# Patient Record
Sex: Female | Born: 1935 | Race: White | Hispanic: No | Marital: Married | Smoking: Former smoker
Health system: Southern US, Community
[De-identification: ages and names within clinical notes are randomized; demographics above are authoritative.]

## PROBLEM LIST (undated history)

## (undated) DIAGNOSIS — G309 Alzheimer's disease, unspecified: Secondary | ICD-10-CM

## (undated) DIAGNOSIS — F419 Anxiety disorder, unspecified: Secondary | ICD-10-CM

## (undated) DIAGNOSIS — F0390 Unspecified dementia without behavioral disturbance: Secondary | ICD-10-CM

## (undated) DIAGNOSIS — C3490 Malignant neoplasm of unspecified part of unspecified bronchus or lung: Secondary | ICD-10-CM

## (undated) DIAGNOSIS — F329 Major depressive disorder, single episode, unspecified: Secondary | ICD-10-CM

## (undated) DIAGNOSIS — R413 Other amnesia: Secondary | ICD-10-CM

## (undated) DIAGNOSIS — F32A Depression, unspecified: Secondary | ICD-10-CM

## (undated) DIAGNOSIS — F0280 Dementia in other diseases classified elsewhere without behavioral disturbance: Secondary | ICD-10-CM

## (undated) DIAGNOSIS — I10 Essential (primary) hypertension: Secondary | ICD-10-CM

## (undated) DIAGNOSIS — G43909 Migraine, unspecified, not intractable, without status migrainosus: Secondary | ICD-10-CM

## (undated) DIAGNOSIS — G629 Polyneuropathy, unspecified: Secondary | ICD-10-CM

## (undated) DIAGNOSIS — J189 Pneumonia, unspecified organism: Secondary | ICD-10-CM

## (undated) DIAGNOSIS — M549 Dorsalgia, unspecified: Secondary | ICD-10-CM

## (undated) DIAGNOSIS — E039 Hypothyroidism, unspecified: Secondary | ICD-10-CM

## (undated) DIAGNOSIS — C801 Malignant (primary) neoplasm, unspecified: Secondary | ICD-10-CM

## (undated) DIAGNOSIS — H9190 Unspecified hearing loss, unspecified ear: Secondary | ICD-10-CM

## (undated) DIAGNOSIS — E785 Hyperlipidemia, unspecified: Secondary | ICD-10-CM

## (undated) DIAGNOSIS — M1990 Unspecified osteoarthritis, unspecified site: Secondary | ICD-10-CM

## (undated) DIAGNOSIS — E7800 Pure hypercholesterolemia, unspecified: Secondary | ICD-10-CM

## (undated) DIAGNOSIS — R569 Unspecified convulsions: Secondary | ICD-10-CM

## (undated) DIAGNOSIS — IMO0002 Reserved for concepts with insufficient information to code with codable children: Secondary | ICD-10-CM

## (undated) DIAGNOSIS — K219 Gastro-esophageal reflux disease without esophagitis: Secondary | ICD-10-CM

## (undated) DIAGNOSIS — R55 Syncope and collapse: Secondary | ICD-10-CM

## (undated) HISTORY — DX: Unspecified osteoarthritis, unspecified site: M19.90

## (undated) HISTORY — DX: Unspecified dementia, unspecified severity, without behavioral disturbance, psychotic disturbance, mood disturbance, and anxiety: F03.90

## (undated) HISTORY — DX: Alzheimer's disease, unspecified: G30.9

## (undated) HISTORY — DX: Other amnesia: R41.3

## (undated) HISTORY — DX: Reserved for concepts with insufficient information to code with codable children: IMO0002

## (undated) HISTORY — DX: Major depressive disorder, single episode, unspecified: F32.9

## (undated) HISTORY — DX: Migraine, unspecified, not intractable, without status migrainosus: G43.909

## (undated) HISTORY — PX: BACK SURGERY: SHX140

## (undated) HISTORY — DX: Polyneuropathy, unspecified: G62.9

## (undated) HISTORY — DX: Malignant (primary) neoplasm, unspecified: C80.1

## (undated) HISTORY — DX: Gastro-esophageal reflux disease without esophagitis: K21.9

## (undated) HISTORY — PX: HEMORRHOID SURGERY: SHX153

## (undated) HISTORY — DX: Hypothyroidism, unspecified: E03.9

## (undated) HISTORY — DX: Pneumonia, unspecified organism: J18.9

## (undated) HISTORY — DX: Unspecified hearing loss, unspecified ear: H91.90

## (undated) HISTORY — DX: Pure hypercholesterolemia, unspecified: E78.00

## (undated) HISTORY — DX: Dementia in other diseases classified elsewhere, unspecified severity, without behavioral disturbance, psychotic disturbance, mood disturbance, and anxiety: F02.80

## (undated) HISTORY — DX: Unspecified dementia without behavioral disturbance: F03.90

## (undated) HISTORY — DX: Hyperlipidemia, unspecified: E78.5

## (undated) HISTORY — DX: Syncope and collapse: R55

## (undated) HISTORY — DX: Essential (primary) hypertension: I10

## (undated) HISTORY — DX: Dorsalgia, unspecified: M54.9

## (undated) HISTORY — PX: VAGINAL HYSTERECTOMY: SUR661

## (undated) HISTORY — DX: Depression, unspecified: F32.A

## (undated) HISTORY — DX: Anxiety disorder, unspecified: F41.9

## (undated) HISTORY — PX: THYROIDECTOMY: SHX17

## (undated) HISTORY — PX: CHOLECYSTECTOMY: SHX55

## (undated) HISTORY — PX: APPENDECTOMY: SHX54

## (undated) HISTORY — DX: Malignant neoplasm of unspecified part of unspecified bronchus or lung: C34.90

## (undated) HISTORY — PX: KNEE SURGERY: SHX244

---

## 2006-12-01 ENCOUNTER — Encounter: Admission: RE | Admit: 2006-12-01 | Discharge: 2006-12-11 | Payer: Self-pay | Admitting: Orthopedic Surgery

## 2007-01-21 ENCOUNTER — Encounter: Admission: RE | Admit: 2007-01-21 | Discharge: 2007-02-20 | Payer: Self-pay | Admitting: Orthopedic Surgery

## 2009-02-20 ENCOUNTER — Encounter: Admission: RE | Admit: 2009-02-20 | Discharge: 2009-04-27 | Payer: Self-pay | Admitting: Orthopedic Surgery

## 2010-02-27 DIAGNOSIS — R55 Syncope and collapse: Secondary | ICD-10-CM

## 2010-02-27 HISTORY — DX: Syncope and collapse: R55

## 2010-04-17 ENCOUNTER — Ambulatory Visit (HOSPITAL_COMMUNITY)
Admission: RE | Admit: 2010-04-17 | Discharge: 2010-04-17 | Payer: Self-pay | Source: Home / Self Care | Attending: Family Medicine | Admitting: Family Medicine

## 2010-04-25 ENCOUNTER — Ambulatory Visit (HOSPITAL_COMMUNITY)
Admission: RE | Admit: 2010-04-25 | Discharge: 2010-04-25 | Payer: Self-pay | Source: Home / Self Care | Attending: Family Medicine | Admitting: Family Medicine

## 2010-04-29 DIAGNOSIS — C3490 Malignant neoplasm of unspecified part of unspecified bronchus or lung: Secondary | ICD-10-CM

## 2010-04-29 HISTORY — DX: Malignant neoplasm of unspecified part of unspecified bronchus or lung: C34.90

## 2010-04-29 HISTORY — PX: OTHER SURGICAL HISTORY: SHX169

## 2010-05-08 ENCOUNTER — Ambulatory Visit
Admission: RE | Admit: 2010-05-08 | Discharge: 2010-05-08 | Payer: Self-pay | Source: Home / Self Care | Attending: Thoracic Surgery | Admitting: Thoracic Surgery

## 2010-05-10 ENCOUNTER — Ambulatory Visit (HOSPITAL_COMMUNITY)
Admission: RE | Admit: 2010-05-10 | Discharge: 2010-05-10 | Payer: Self-pay | Source: Home / Self Care | Attending: Thoracic Surgery | Admitting: Thoracic Surgery

## 2010-05-20 ENCOUNTER — Encounter: Payer: Self-pay | Admitting: Family Medicine

## 2010-05-23 LAB — CBC
HCT: 34.3 % — ABNORMAL LOW (ref 36.0–46.0)
MCV: 92.7 fL (ref 78.0–100.0)
RBC: 3.7 MIL/uL — ABNORMAL LOW (ref 3.87–5.11)
WBC: 7.5 10*3/uL (ref 4.0–10.5)

## 2010-05-23 LAB — APTT: aPTT: 29 seconds (ref 24–37)

## 2010-05-23 LAB — URINALYSIS, ROUTINE W REFLEX MICROSCOPIC
Bilirubin Urine: NEGATIVE
Hgb urine dipstick: NEGATIVE
Specific Gravity, Urine: 1.026 (ref 1.005–1.030)
Urine Glucose, Fasting: NEGATIVE mg/dL
Urobilinogen, UA: 0.2 mg/dL (ref 0.0–1.0)

## 2010-05-23 LAB — BLOOD GAS, ARTERIAL
Acid-base deficit: 0.6 mmol/L (ref 0.0–2.0)
Bicarbonate: 23.3 mEq/L (ref 20.0–24.0)
TCO2: 24.4 mmol/L (ref 0–100)
pCO2 arterial: 36.7 mmHg (ref 35.0–45.0)
pO2, Arterial: 83.3 mmHg (ref 80.0–100.0)

## 2010-05-23 LAB — COMPREHENSIVE METABOLIC PANEL
AST: 30 U/L (ref 0–37)
Albumin: 3.5 g/dL (ref 3.5–5.2)
Alkaline Phosphatase: 83 U/L (ref 39–117)
Chloride: 105 mEq/L (ref 96–112)
Creatinine, Ser: 1.35 mg/dL — ABNORMAL HIGH (ref 0.4–1.2)
GFR calc Af Amer: 46 mL/min — ABNORMAL LOW (ref >60)
Potassium: 5 mEq/L (ref 3.5–5.1)
Total Bilirubin: 0.1 mg/dL — ABNORMAL LOW (ref 0.3–1.2)
Total Protein: 7.1 g/dL (ref 6.0–8.3)

## 2010-05-23 LAB — PROTIME-INR: INR: 0.98 (ref 0.00–1.49)

## 2010-05-23 LAB — URINE MICROSCOPIC-ADD ON

## 2010-05-23 LAB — SURGICAL PCR SCREEN: Staphylococcus aureus: NEGATIVE

## 2010-05-24 ENCOUNTER — Inpatient Hospital Stay (HOSPITAL_COMMUNITY)
Admission: RE | Admit: 2010-05-24 | Discharge: 2010-05-29 | Disposition: A | Discharge status: Home / Self Care | Payer: PRIVATE HEALTH INSURANCE | Source: Home / Self Care | Attending: Thoracic Surgery | Admitting: Thoracic Surgery

## 2010-05-24 LAB — TYPE AND SCREEN: ABO/RH(D): O POS

## 2010-05-25 LAB — POCT I-STAT 3, ART BLOOD GAS (G3+)
Acid-Base Excess: 2 mmol/L (ref 0.0–2.0)
Bicarbonate: 25.9 mEq/L — ABNORMAL HIGH (ref 20.0–24.0)
pH, Arterial: 7.432 — ABNORMAL HIGH (ref 7.350–7.400)
pO2, Arterial: 71 mmHg — ABNORMAL LOW (ref 80.0–100.0)

## 2010-05-25 LAB — BASIC METABOLIC PANEL
Calcium: 8.8 mg/dL (ref 8.4–10.5)
Chloride: 102 mEq/L (ref 96–112)
Creatinine, Ser: 1.05 mg/dL (ref 0.4–1.2)
GFR calc Af Amer: >60 mL/min (ref >60)
Sodium: 137 mEq/L (ref 135–145)

## 2010-05-25 LAB — CBC
MCV: 93.3 fL (ref 78.0–100.0)
Platelets: 182 10*3/uL (ref 150–400)
RBC: 3.12 MIL/uL — ABNORMAL LOW (ref 3.87–5.11)
RDW: 14 % (ref 11.5–15.5)
WBC: 12.2 10*3/uL — ABNORMAL HIGH (ref 4.0–10.5)

## 2010-05-26 LAB — COMPREHENSIVE METABOLIC PANEL
AST: 42 U/L — ABNORMAL HIGH (ref 0–37)
Albumin: 2.7 g/dL — ABNORMAL LOW (ref 3.5–5.2)
Alkaline Phosphatase: 79 U/L (ref 39–117)
CO2: 27 mEq/L (ref 19–32)
Chloride: 101 mEq/L (ref 96–112)
GFR calc Af Amer: >60 mL/min (ref >60)
Potassium: 4.1 mEq/L (ref 3.5–5.1)
Total Bilirubin: 0.5 mg/dL (ref 0.3–1.2)

## 2010-05-26 LAB — CBC
Hemoglobin: 9.8 g/dL — ABNORMAL LOW (ref 12.0–15.0)
MCV: 93.1 fL (ref 78.0–100.0)
Platelets: 176 10*3/uL (ref 150–400)
RBC: 3.2 MIL/uL — ABNORMAL LOW (ref 3.87–5.11)
WBC: 14.5 10*3/uL — ABNORMAL HIGH (ref 4.0–10.5)

## 2010-05-27 LAB — TYPE AND SCREEN
ABO/RH(D): O POS
Antibody Screen: NEGATIVE
Unit division: 0
Unit division: 0

## 2010-05-27 LAB — MRSA PCR SCREENING: MRSA by PCR: NEGATIVE

## 2010-05-27 LAB — BASIC METABOLIC PANEL
Calcium: 8.9 mg/dL (ref 8.4–10.5)
Creatinine, Ser: 1.12 mg/dL (ref 0.4–1.2)
GFR calc Af Amer: 57 mL/min — ABNORMAL LOW (ref >60)
GFR calc non Af Amer: 47 mL/min — ABNORMAL LOW (ref >60)

## 2010-05-27 LAB — CBC
MCH: 30.6 pg (ref 26.0–34.0)
MCHC: 33.4 g/dL (ref 30.0–36.0)
Platelets: 211 10*3/uL (ref 150–400)
RDW: 13.3 % (ref 11.5–15.5)

## 2010-05-27 NOTE — Op Note (Signed)
[name], [name]               ACCOUNT NO.:  [id number]  MEDICAL RECORD NO.:  [id number]           PATIENT TYPE:  LOCATION:                                 FACILITY:  PHYSICIAN:  Ines Bloomer, M.D. DATE OF BIRTH:  [birth year]  DATE OF PROCEDURE: DATE OF DISCHARGE:                              OPERATIVE REPORT   PREOPERATIVE DIAGNOSIS:  Left upper lobe mass.  POSTOPERATIVE DIAGNOSIS:  Adenocarcinoma, left upper lobe.  OPERATION PERFORMED:  Left upper lobe apical posterior segmentectomy with node dissection.  SURGEON:  Ines Bloomer, MD  ANESTHESIA:  General anesthesia.  After percutaneous insertion of all monitoring lines, the patient underwent general anesthesia and was prepped and draped in the usual sterile manner.  The patient was turned to the left lateral thoracotomy position.  The left lung was deflated.  Two trocar sites were made, one at the anterior aspect of the seventh intercostal space and one at the posterior axillary line of the eighth intercostal space.  Two trocars were inserted.  A 0-degree scope was inserted.  The lesion was seen in the apical segment that was kind of posteriorly in the left upper lobe. A third incision was made over the triangle of auscultation and the latissimus was partially divided and the serratus was reflected anteriorly.  The intercostal muscle between the fifth and sixth ribs were taken down with electrocautery and a portion of the fifth rib was divided at the angle with shears and TPA was inserted.  The lung was hyperinflated, so it was difficult to do a dissection with the dissection with shear on the superior portion of the fissures dissecting out an 11L node and dissecting out the anterior branch artery.  This was stapled and divided with Covidien stapler.  We then dissected out two 5 nodes and another 10L node and exposed the apical branch which was stapled out and divided with the stapler.  There was one kind of  more posterior branch still remaining but I dif not think we had to divide that.  We then came across the staple line across the lung with the Covidien purple stapler with several applications.  The lesion was sent for frozen section which was an adenocarcinoma.  There was 1 atypical cell at the staple line, but the pathologist thought that, that was benign.  We had at least a 2.5-cm gross margin.  Because of the patient's pulmonary status which I felt was borderline, we went ahead and inserted 2 chest tubes through the trocar sites.  We did a single On- Q and a Marcaine block in the usual fashion.  Chest was closed with 3 pericostals drilling through the sixth rib and passing around the fifth rib and putting the intercostal muscle in between, #1 Vicryl on the muscle layer and 2-0 Vicryl on the subcutaneous tissue, and Dermabond for the skin.  The patient is turned to the recovery room in stable condition.     Ines Bloomer, M.D.     DPB/MEDQ  D:  05/24/2010  T:  05/25/2010  Job:  [id number]  Electronically Signed by Jovita Gamma M.D. on 05/27/2010  05:21:52 PM

## 2010-05-27 NOTE — H&P (Signed)
Suzanne Fritz, Suzanne Fritz               ACCOUNT NO.:  [id number]  MEDICAL RECORD NO.:  [id number]           PATIENT TYPE:  LOCATION:                                 FACILITY:  PHYSICIAN:  Ines Bloomer, M.D. DATE OF BIRTH:  1931/09/07  DATE OF ADMISSION: DATE OF DISCHARGE:                             HISTORY & PHYSICAL   CHIEF COMPLAINT:  Left upper lobe mass.  HISTORY OF PRESENT ILLNESS:  This 77 year old patient had smoked for 30 years but quit smoking when she was found to have a left upper lobe mass and a needle biopsy showed epithelioid cells, suspicious for malignancy. PET scan was positive in this area but on no other areas.  She has history of bronchitis, was treated with Rocephin, the standard uptake value was 3.2.  She has had no hemoptysis, fever, chills, or excessive sputum.  Her pulmonary function tests were done.  The lesion was 1.8 x 2.1.  MEDICATIONS:  She is on alprazolam, Zyrtec, Hemocyte Plus, Neurontin,fluoxetine, hydrocodone, levothyroxine, pravastatin, benazepril, Namenda, triamterene and hydrochlorothiazide, estradiol, aspirin.  She is allergic to CODEINE with nausea and vomiting.  She has hypertension, hypercholesterolemia, history of depression.  FAMILY HISTORY:  Noncontributory.  SOCIAL HISTORY:  She is married, three chill, retired.  Quit smoking 36 years ago.  Does not drink alcohol on a regular basis.  REVIEW OF SYSTEMS:  VITAL SIGNS:  Her weight is 5 feet 6 inches.  She is 230 pounds. GENERAL:  Her weight, she had some recent weight gain. CARDIAC:  No angina or atrial fibrillation. PULMONARY:  See history of present illness. GASTROINTESTINAL:  Reflux and some history of melanoma. GENITOURINARY:  No kidney disease, dysuria, or frequent urination. VASCULAR:  No claudication, DVT, or TIAs. NEUROLOGIC:  Headaches. MUSCULOSKELETAL:  No arthritis in joints. PSYCHIATRIC:  Depression. EYE/ENT:  She had recent decrease in her eyesight.  No changes in  her hearing. HEMATOLOGIC:  No problems with bleeding, clotting disorders, or anemia.  PHYSICAL EXAMINATION:  GENERAL:  She is slightly obese Caucasian female, in no acute distress. VITAL SIGNS:  Blood pressure is 102/60, pulse 88, respirations 18, sats were 98%. HEENT:  Head is atraumatic.  Eyes:  Pupils equal and reactive to light and accommodation.  Extraocular movements are normal.  Ears:  Tympanic membranes intact.  Nose:  There is no septal deviation.  Throat is without lesion. NECK:  Supple without thyromegaly.  There is no supraclavicular or axillary adenopathy. CHEST:  Clear to auscultation and percussion. HEART:  Regular sinus rhythm.  No murmurs. ABDOMEN:  Soft and obese.  Bowel sounds are normal. EXTREMITIES:  Pulses are 2+.  There is no clubbing or edema. NEUROLOGIC:  She is oriented x3.  Sensory and motor intact.  Cranial nerves intact.  IMPRESSION: 1. Left upper lobe mass. 2. History of tobacco abuse. 3. Hypertension. 4. Hypercholesterolemia. 5. History of depression.  PLAN:  Left VATS and probable left upper lobe trisegmentectomy and node dissection.     Ines Bloomer, M.D.     DPB/MEDQ  D:  05/21/2010  T:  05/22/2010  Job:  [id number]  Electronically Signed by Jovita Gamma  M.D. on 05/27/2010 05:20:55 PM

## 2010-05-28 LAB — CBC
MCV: 91.3 fL (ref 78.0–100.0)
Platelets: 240 10*3/uL (ref 150–400)
RDW: 13.1 % (ref 11.5–15.5)
WBC: 13.4 10*3/uL — ABNORMAL HIGH (ref 4.0–10.5)

## 2010-05-28 LAB — BASIC METABOLIC PANEL
BUN: 17 mg/dL (ref 6–23)
GFR calc non Af Amer: 44 mL/min — ABNORMAL LOW (ref >60)
Potassium: 3.9 mEq/L (ref 3.5–5.1)
Sodium: 133 mEq/L — ABNORMAL LOW (ref 135–145)

## 2010-05-29 LAB — BASIC METABOLIC PANEL
CO2: 26 mEq/L (ref 19–32)
Chloride: 98 mEq/L (ref 96–112)
GFR calc Af Amer: >60 mL/min (ref >60)
Sodium: 134 mEq/L — ABNORMAL LOW (ref 135–145)

## 2010-05-29 LAB — CBC
Hemoglobin: 9.2 g/dL — ABNORMAL LOW (ref 12.0–15.0)
MCH: 30.4 pg (ref 26.0–34.0)
RBC: 3.03 MIL/uL — ABNORMAL LOW (ref 3.87–5.11)

## 2010-06-03 NOTE — Discharge Summary (Signed)
Suzanne Fritz, Suzanne Fritz               ACCOUNT NO.:  [id number]  MEDICAL RECORD NO.:  [id number]          PATIENT TYPE:  INP  LOCATION:  3308                         FACILITY:  [facility]  PHYSICIAN:  Ines Bloomer, M.D. DATE OF BIRTH:  1932/04/22  DATE OF ADMISSION:  05/24/2010 DATE OF DISCHARGE:  05/29/2010                              DISCHARGE SUMMARY   HISTORY:  The patient is a 77 year old white female with a history of long-term tobacco abuse, although she has quit who was found and referred to Dr. Edwyna Shell to have a left upper lobe lung mass.  The patient had a needle biopsy, which showed epithelioid cells and findings suspicious for malignancy.  She underwent a PET scan, and it was positive only for this lesion in the left upper lobe.  The patient has a history of recent bronchitis treated with Rocephin.  She was referred to Dr. Edwyna Shell, and he felt she would require resection.  She was admitted this hospitalization for the procedure.  PAST MEDICAL HISTORY:  Gastroesophageal reflux, peripheral neuropathy, hypothyroidism, hypercholesterolemia, early dementia, hypertension, depression.  ALLERGIES:  CODEINE causes nausea.  FAMILY HISTORY:  Noncontributory.  SOCIAL HISTORY:  She is retired.  She quit smoking 36 years ago.  She does not drink alcohol on a regular basis.  She is married.  She has three children.  PHYSICAL EXAMINATION AND REVIEW OF SYSTEMS:  Please see the history and physical.  HOSPITAL COURSE:  The patient was admitted electively on May 24, 2010.  She underwent a left upper lobe apical posterior segmentectomy with lymph node sampling by Dr. Edwyna Shell.  She tolerated the procedure well and was taken to the Post Anesthesia Care Unit in stable condition. The initial frozen section was notable for non-small cell lung cancer.  POSTOPERATIVE HOSPITAL COURSE:  The patient has overall progressed nicely.  She has maintained stable hemodynamics.  All routine  lines, monitors, and drainage devices have been discontinued in the standard fashion or routine protocols.  She does have acute blood loss anemia with most recent hemoglobin/hematocrit 9.2 and 27.6 respectively.  She has been started on her iron.  Her incisions are healing well without evidence of infection.  She is tolerating gradual increase in activity using standard protocols.  Chest x-ray is felt to be stable in appearance with some left perihilar fullness, but aeration is improving overtime.  She has been weaned from oxygen and maintained on good saturations on room air.  She has had no significant cardiac dysrhythmias.  Overall, she was felt to be stable for discharge on today's date May 29, 2010.  Pathology revealed adenocarcinoma 2.8 cm with no margin involvement, and no tumor identified in the multiple lymph node samples.  Medications on discharge include the following, new medications will be: 1. Ferrous sulfate 325 mg daily. 2. Oxycodone/APAP 5/325 mg 1-2 every 4-6 hours as needed for pain. 3. Senokot-S p.r.n. at bedtime.  Her other medications are the same and include the following: 1. Aleve 220 mg daily p.r.n. 2. Aspirin 81 mg daily. 3. Calcium 600 mg q.a.m. 4. Cetirizine 10 mg q.a.m. 5. Vitamin B12 1000 mcg q.a.m. 6. Donepezil  10 mg 1 tablet daily at bedtime. 7. Estradiol 0.5 mg b.i.d. 8. Fluoxetine 40 mg q.a.m. 9. Gabapentin 300 mg q.a.m. 10.Glucosamine chondroitin 1 capsule q.a.m. 11.Hemocyte Plus 1 tablet twice daily. 12.Levothyroxine 100 mcg alternating daily with 88 mcg. 13.Meclizine 25 mg p.r.n. for dizziness. 14.Namenda 10 mg b.i.d. 15.Omeprazole 20 mg b.i.d. 16.Pravachol 80 mg q.a.m. 17.Prenatal vitamin 1 daily. 18.Promethazine 25 mg p.o. p.r.n. 19.Triamterene/hydrochlorothiazide 37.5/25 mg 1 q.a.m. 20.Vitamin D 1000 units at bedtime. 21.Vitamin E 400 units b.i.d.  FINAL DIAGNOSES:  Adenocarcinoma of the left upper lobe status post apical  posterior segmentectomy and lymph node sampling.  OTHER DIAGNOSES:  Acute blood loss anemia, history of hypertension, history of hypercholesterolemia, history of depression, history of dementia, history of neuropathy, history of gastroesophageal reflux.  INSTRUCTIONS:  The patient will receive written instructions in regard to medications, activity, diet, wound care, and followup.  FOLLOWUP:  Dr. Edwyna Shell on June 15, 2010, at 3:00 p.m. with a chest x- ray from [facility].     Rowe Clack, P.A.-C.   ______________________________ Ines Bloomer, M.D.   Sherryll Burger  D:  05/29/2010  T:  05/30/2010  Job:  [id number]  cc:   Ernestina Penna, M.D.  Electronically Signed by Gershon Crane P.A.-C. on 05/31/2010 01:10:23 PM Electronically Signed by Jovita Gamma M.D. on 06/03/2010 04:59:07 PM

## 2010-06-04 ENCOUNTER — Other Ambulatory Visit: Payer: Self-pay | Admitting: Thoracic Surgery

## 2010-06-04 DIAGNOSIS — D381 Neoplasm of uncertain behavior of trachea, bronchus and lung: Secondary | ICD-10-CM

## 2010-06-05 ENCOUNTER — Ambulatory Visit (INDEPENDENT_AMBULATORY_CARE_PROVIDER_SITE_OTHER): Payer: Medicare Other | Admitting: Thoracic Surgery

## 2010-06-05 ENCOUNTER — Ambulatory Visit
Admission: RE | Admit: 2010-06-05 | Discharge: 2010-06-05 | Disposition: A | Discharge status: Home / Self Care | Payer: PRIVATE HEALTH INSURANCE | Source: Ambulatory Visit | Attending: Thoracic Surgery | Admitting: Thoracic Surgery

## 2010-06-05 DIAGNOSIS — C3410 Malignant neoplasm of upper lobe, unspecified bronchus or lung: Secondary | ICD-10-CM

## 2010-06-05 DIAGNOSIS — D381 Neoplasm of uncertain behavior of trachea, bronchus and lung: Secondary | ICD-10-CM

## 2010-06-08 NOTE — Letter (Signed)
June 05, 2010  Ernestina Penna, MD [address] [place] [address]  Re:  KYLEI, PURINGTON               DOB:  1931/10/27  Dr. Roe Coombs,  I saw the patient back today after we did a left upper lobe apical segmentectomy for a stage IB non-small cell lung cancer.  Incisions are well-healed.  She is doing well overall.  Her blood pressure was 140/80, pulse 80, respirations 18, sats were 97%.  Plan to see her back again in 3 weeks with a chest x-ray.  I told her to gradually increase her activity.  Ines Bloomer, M.D. Electronically Signed  DPB/MEDQ  D:  06/05/2010  T:  06/06/2010  Job:  [id number]

## 2010-06-18 ENCOUNTER — Other Ambulatory Visit: Payer: Self-pay | Admitting: Thoracic Surgery

## 2010-06-18 DIAGNOSIS — C3410 Malignant neoplasm of upper lobe, unspecified bronchus or lung: Secondary | ICD-10-CM

## 2010-06-19 ENCOUNTER — Ambulatory Visit (INDEPENDENT_AMBULATORY_CARE_PROVIDER_SITE_OTHER): Payer: Self-pay | Admitting: Thoracic Surgery

## 2010-06-19 ENCOUNTER — Ambulatory Visit
Admission: RE | Admit: 2010-06-19 | Discharge: 2010-06-19 | Disposition: A | Discharge status: Home / Self Care | Payer: Medicare Other | Source: Ambulatory Visit | Attending: Thoracic Surgery | Admitting: Thoracic Surgery

## 2010-06-19 DIAGNOSIS — C3410 Malignant neoplasm of upper lobe, unspecified bronchus or lung: Secondary | ICD-10-CM

## 2010-06-20 NOTE — Assessment & Plan Note (Unsigned)
OFFICE VISIT  Suzanne Fritz, Suzanne Fritz DOB:  [birth year]                                        June 19, 2010 CHART #:  [id number]  The patient is status post left thoracotomy with left upper lobe echo apical posterior segmentectomy done by Dr. Edwyna Shell on May 24, 2010. This showed stage IB non-small cell lung cancer.  She was last seen in the office by Dr. Edwyna Shell on June 05, 2010.  She presents today for a 3-week follow-up.  The patient feels that she is progressing well.  She is up ambulating well without difficulty.  She denies any shortness of breath, cough, hemoptysis, or wheezing.  She denies any fevers.  She has been to see the oncologist.  PHYSICAL EXAMINATION:  VITAL SIGNS:  Blood pressure 115/71, pulse 104, respirations 18, O2 sats 98% on room air.  RESPIRATORY:  Clear to auscultation bilaterally.  CARDIAC:  Regular rate and rhythm. INCISIONS:  Thoracotomy site is healing well.  The patient's one chest tube site dehisced, but no purulent drainage or erythema noted.  STUDIES:  The patient had PA and lateral chest x-ray obtained today, which shows postoperative changes.  No pneumothorax noted.  IMPRESSION AND PLAN:  The patient is progressing well postoperatively. She is to continue to increase her activity as tolerated.  She is to wash her chest tube site using soap and water daily and bandage with a dry dressing.  The patient is to contact us if this area starts draining, becomes painful, or red.  We will see her back sooner.  We will plan to see her back in 3 weeks with a followup chest x-ray at that time.  The patient is in agreement.  Sol Blazing, PA  KMD/MEDQ  D:  06/19/2010  T:  06/20/2010  Job:  [id number]  cc:   Ernestina Penna, M.D.

## 2010-07-09 ENCOUNTER — Other Ambulatory Visit: Payer: Self-pay | Admitting: Thoracic Surgery

## 2010-07-09 DIAGNOSIS — C3410 Malignant neoplasm of upper lobe, unspecified bronchus or lung: Secondary | ICD-10-CM

## 2010-07-09 LAB — GLUCOSE, CAPILLARY: Glucose-Capillary: 98 mg/dL (ref 70–99)

## 2010-07-09 LAB — CBC
Hemoglobin: 12 g/dL (ref 12.0–15.0)
MCH: 30.3 pg (ref 26.0–34.0)
MCV: 92.2 fL (ref 78.0–100.0)
RBC: 3.96 MIL/uL (ref 3.87–5.11)

## 2010-07-10 ENCOUNTER — Ambulatory Visit (INDEPENDENT_AMBULATORY_CARE_PROVIDER_SITE_OTHER): Payer: Self-pay | Admitting: Thoracic Surgery

## 2010-07-10 ENCOUNTER — Ambulatory Visit
Admission: RE | Admit: 2010-07-10 | Discharge: 2010-07-10 | Disposition: A | Discharge status: Home / Self Care | Payer: Medicare Other | Source: Ambulatory Visit | Attending: Thoracic Surgery | Admitting: Thoracic Surgery

## 2010-07-10 DIAGNOSIS — C3410 Malignant neoplasm of upper lobe, unspecified bronchus or lung: Secondary | ICD-10-CM

## 2010-07-10 DIAGNOSIS — C3490 Malignant neoplasm of unspecified part of unspecified bronchus or lung: Secondary | ICD-10-CM

## 2010-07-11 NOTE — Assessment & Plan Note (Unsigned)
OFFICE VISIT  Suzanne Fritz, DEACON DOB:  [birth year]                                        July 10, 2010 CHART #:  [id number]  HISTORY OF PRESENT ILLNESS:  The patient is status post left thoracotomy with left upper lobe apical posterior segmentectomy done by Dr. Edwyna Shell on May 24, 2010.  This showed stage one B non-small cell lung cancer.  She has seen the oncologist and does not require any further treatment at this time.  The patient presents today for followup visit. She still had some mild pain radiating around her left breast. Otherwise, the patient feels she is progressing well.  She is up ambulating well without difficulty.  She states her chest tube site is healing well.  She denies any nausea, vomiting, fevers, coughing, hemoptysis, or wheezing.  PHYSICAL EXAMINATION:  VITAL SIGNS:  Blood pressure 109/66, pulse of 96, respirations of 18, O2 sats 97% on room air. RESPIRATORY:  Clear to auscultation bilaterally. CARDIAC:  Regular rate and rhythm.  Incisions:  All incisions are clean, dry and intact and healing well.  The patient's chest tube site continues to heal well.  No signs of purulent drainage or erythema.  IMPRESSION AND PLAN:  Ms. Hakanson continues to progress well postoperatively.  She was seen and evaluated today by Dr. Edwyna Shell.  PLAN:  Continue to progress with her activity as tolerated.  We will plan to see her back in 3 months with a repeat PA and lateral chest x- ray at that time.  In the interim, if the patient has any surgical complaints.  She is to contact us and we will see her sooner.  The patient is in agreement.  I did give the patient a prescription for refill on her hydrocodone 7.5/500, total #40.  Sol Blazing, PA  KMD/MEDQ  D:  07/10/2010  T:  07/11/2010  Job:  [id number]  cc:   Ernestina Penna, M.D.

## 2010-09-11 ENCOUNTER — Encounter: Payer: Self-pay | Admitting: Cardiology

## 2010-09-11 NOTE — Letter (Signed)
May 08, 2010   Ernestina Penna, M.D.  [address] [place], [place] [address]   Re:  RECIE, CIRRINCIONE               DOB:  10-24-1931   Dear Roe Coombs;   I appreciate the opportunity of seeing the patient.  This 77 year old  Caucasian female, smoked for 30 years but quit smoking now, was found to  have a left upper lobe mass in which she underwent a needle biopsy which  showed some epithelioid cells suspicious for malignancy.  Her PET scan  is positive in this area and no other areas.  She has a history of  bronchitis and was treated with Rocephin.  A standard uptake value on  the PET scan was 3.5, all of this corresponds with a malignancy.  She is  referred here for a possible treatment.  She has had no hemoptysis,  fever, chills or excessive sputum.   MEDICATIONS:  Omeprazole 20 mg twice a day, niclosamide 75 mg twice a  day, Zyrtec 10 mg a day, Hemocyte Plus twice a day, Neurontin 300 mg at  night, fluoxetine 40 mg in the morning, hydrocodone, levothyroxine 81  mcg a day, pravastatin 80 mg a day, benazepril 10 mg at night, Namenda  10 mg twice a day, triamterene/hydrochlorothiazide 37.5 daily, estradiol  0.5 twice a day, aspirin, calcium, multiple vitamins.   ALLERGIES:  Codeine causes her nausea.   She has hypertension, hypercholesterolemia, depression.   FAMILY HISTORY:  Noncontributory.   SOCIAL HISTORY:  She is married, has three children.  She is retired.  Quit smoking 36 years ago.  Does not drink alcohol on a regular basis.   PHYSICAL EXAMINATION:  Weight she is 5 feet 6 inches, 230 pounds.  She  had some recent weight gain.  CARDIAC:  No angina or atrial fibrillation.  PULMONARY:  See history of present illness.  GI:  Reflux.  Had previous melena.  GU:  No kidney disease, dysuria, or frequent urination.  VASCULAR:  No claudication, DVT, or TIAs.  NEUROLOGICAL:  Headaches.  MUSCULOSKELETAL:  Arthritis and joint pain.  PSYCHIATRIC:  Depression.  EYE/ENT:  She had  recent decrease in her eyesight.  HEMATOLOGICAL:  No problems with bleeding, clotting disorders, or  anemia.   PHYSICAL EXAMINATION:  She is a slightly obese Caucasian female in no  acute distress.  Her blood pressure is 102/62, pulse 88, respirations  18, saturations were 98%.  Head, Eyes, Ears, Nose and Throat:  Unremarkable.  Neck:  Supple without thyromegaly.  There is no  supraclavicular or axillary adenopathy.  Chest:  Clear to auscultation  and percussion.  Heart:  Regular sinus rhythm.  No murmurs.  Abdomen:  Obese.  Bowel sounds are normal.  Extremities:  Pulses are 2+.  No  clubbing or edema.  Neurological:  She is oriented x3.  Sensory and  motor intact.  Cranial nerves intact.   I feel that she probably has a low grade cancer and would benefit from  VATS trisegmentectomy and node dissection.  We will get pulmonary  function tests on her and brain scan that was negative.  If those are  negative, plan to proceed with surgery on January 26 at [facility].   I appreciate the opportunity of seeing the patient.   Ines Bloomer, M.D.  Electronically Signed   DPB/MEDQ  D:  05/08/2010  T:  05/09/2010  Job:  [id number]   cc:   Tania Ade  Darl Pikes, NP

## 2010-09-12 ENCOUNTER — Ambulatory Visit (INDEPENDENT_AMBULATORY_CARE_PROVIDER_SITE_OTHER): Payer: Medicare Other | Admitting: Cardiology

## 2010-09-12 ENCOUNTER — Encounter: Payer: Self-pay | Admitting: Cardiology

## 2010-09-12 DIAGNOSIS — Z01810 Encounter for preprocedural cardiovascular examination: Secondary | ICD-10-CM

## 2010-09-12 DIAGNOSIS — E785 Hyperlipidemia, unspecified: Secondary | ICD-10-CM

## 2010-09-12 DIAGNOSIS — I10 Essential (primary) hypertension: Secondary | ICD-10-CM

## 2010-09-12 NOTE — Patient Instructions (Signed)
Continue current medications You are OK for surgery Follow up is needed

## 2010-09-12 NOTE — Assessment & Plan Note (Signed)
I reviewed her recent lipids. Her triglycerides were markedly elevated and we discussed dietary changes which she has not been following. After she's been doing this for 3 or 4 months she should have repeat labs.

## 2010-09-12 NOTE — Assessment & Plan Note (Signed)
The patient is an acceptable risk for the planned surgery. At this point no further testing is indicated according to the ACC/AHA guidelines. She has a moderate functional level, no high-risk findings or risk factors and is going for surgery there is moderate risk from a cardiovascular standpoint.

## 2010-09-12 NOTE — Assessment & Plan Note (Signed)
The blood pressure is at target. No change in medications is indicated. We will continue with therapeutic lifestyle changes (TLC).  

## 2010-09-12 NOTE — Progress Notes (Signed)
HPI The patient presents for preoperative clearance. She is to have hip surgery. She's not had any prior cardiac history. I reviewed all available records. Of note she recently had left upper lobectomy for adenocarcinoma. She tolerated this surgery without problems. She is somewhat limited by her hip pain but she can ambulate. With this level of activity she denies any chest pressure, neck or arm discomfort. She has had no palpitations, presyncope or syncope. She has had no weight gain or edema. She denies any shortness of breath, PND or orthopnea.  Allergies  Allergen Reactions  . Codeine     Current Outpatient Prescriptions  Medication Sig Dispense Refill  . aspirin 81 MG tablet Take 81 mg by mouth daily.        . Calcium Carbonate (CALCIUM 600 PO) Take by mouth. 1 po daily       . Cholecalciferol (VITAMIN D) 1000 UNITS capsule Take 1,000 Units by mouth daily.        Marland Kitchen donepezil (ARICEPT) 10 MG tablet Take 10 mg by mouth at bedtime as needed.        Marland Kitchen FLUoxetine (PROZAC) 40 MG capsule Take 40 mg by mouth daily.        Marland Kitchen glucosamine-chondroitin 500-400 MG tablet Take 1 tablet by mouth daily.        Marland Kitchen levothyroxine (SYNTHROID, LEVOTHROID) 100 MCG tablet Take 100 mcg by mouth.       . meclizine (ANTIVERT) 25 MG tablet Take 25 mg by mouth as needed.        . memantine (NAMENDA) 10 MG tablet Take 10 mg by mouth 2 (two) times daily.        Marland Kitchen omeprazole (PRILOSEC) 20 MG capsule Take 20 mg by mouth 2 (two) times daily.        . promethazine (PHENERGAN) 25 MG tablet Take 25 mg by mouth every 6 (six) hours as needed.        . triamterene-hydrochlorothiazide (MAXZIDE-25) 37.5-25 MG per tablet Take 1 tablet by mouth daily.        . vitamin E 400 UNIT capsule Take 400 Units by mouth daily.        Marland Kitchen DISCONTD: cetirizine (ZYRTEC) 10 MG tablet Take 10 mg by mouth daily.        Marland Kitchen DISCONTD: B Complex-C-Min-Fe-FA (HEMOCYTE-PLUS PO) Take by mouth. 1 po bid       . DISCONTD: cyanocobalamin 1000 MCG tablet Take  100 mcg by mouth daily.        Marland Kitchen DISCONTD: estradiol (ESTRACE) 0.5 MG tablet Take 0.5 mg by mouth daily.        Marland Kitchen DISCONTD: ferrous sulfate 325 (65 FE) MG EC tablet Take 325 mg by mouth daily with breakfast.        . DISCONTD: gabapentin (NEURONTIN) 300 MG capsule Take 300 mg by mouth daily.        Marland Kitchen DISCONTD: naproxen sodium (ANAPROX) 220 MG tablet Take 220 mg by mouth as needed.        Marland Kitchen DISCONTD: oxyCODONE-acetaminophen (PERCOCET) 5-325 MG per tablet Take 1 tablet by mouth every 4 (four) hours as needed.        Marland Kitchen DISCONTD: pravastatin (PRAVACHOL) 80 MG tablet Take 80 mg by mouth daily.        Marland Kitchen DISCONTD: Prenatal MV-Min-Fe Fum-FA-DHA (PRENATAL 1 PO) Take by mouth. 1 po daily       . DISCONTD: Sennosides (SENOKOT PO) Take by mouth daily.  Past Medical History  Diagnosis Date  . Hypercholesterolemia   . Depression   . Hypertension   . GERD (gastroesophageal reflux disease)   . Peripheral neuropathy   . Dementia   . Hypothyroidism   . Adenocarcinoma     Past Surgical History  Procedure Date  . Left upper lobe resection   . Appendectomy   . Thyroidectomy   . Cholecystectomy     Family History  Problem Relation Age of Onset  . Stroke Mother 49  . Cancer Father 82    Stomach    History   Social History  . Marital Status: Married    Spouse Name: N/A    Number of Children: 3  . Years of Education: N/A   Occupational History  . Retired    Social History Main Topics  . Smoking status: Former Smoker    Quit date: 04/29/1974  . Smokeless tobacco: Not on file  . Alcohol Use: Not on file  . Drug Use: Not on file  . Sexually Active: Not on file   Other Topics Concern  . Not on file   Social History Narrative    She is married, three chill, retired.  Quit smoking 36  years ago.  Does not drink alcohol on a regular basis.     ROS:  As stated in the HPI and negative for all other systems.   PHYSICAL EXAM BP 112/60  Pulse 70  Resp 16  Ht 5\' 6"  (1.676 m)   Wt 192 lb (87.091 kg)  BMI 30.99 kg/m2 GENERAL:  Well appearing HEENT:  Pupils equal round and reactive, fundi not visualized, oral mucosa unremarkable NECK:  No jugular venous distention, waveform within normal limits, carotid upstroke brisk and symmetric, no bruits, no thyromegaly LYMPHATICS:  No cervical, inguinal adenopathy LUNGS:  Clear to auscultation bilaterally BACK:  No CVA tenderness CHEST:  Unremarkable, left surgical scar HEART:  PMI not displaced or sustained,S1 and S2 within normal limits, no S3, no S4, no clicks, no rubs, no murmurs ABD:  Flat, positive bowel sounds normal in frequency in pitch, no bruits, no rebound, no guarding, no midline pulsatile mass, no hepatomegaly, no splenomegaly EXT:  2 plus pulses throughout, no edema, no cyanosis no clubbing SKIN:  No rashes no nodules NEURO:  Cranial nerves II through XII grossly intact, motor grossly intact throughout PSYCH:  Cognitively intact, oriented to person place and time   EKG:  Sinus rhythm, rate 72, axis within normal limits, intervals within normal limits, premature atrial contractions, lateral T wave inversion nonspecific  ASSESSMENT AND PLAN

## 2010-09-17 ENCOUNTER — Encounter: Payer: Self-pay | Admitting: Nurse Practitioner

## 2010-09-25 ENCOUNTER — Other Ambulatory Visit: Payer: Self-pay | Admitting: Orthopedic Surgery

## 2010-09-25 ENCOUNTER — Ambulatory Visit (HOSPITAL_COMMUNITY)
Admission: RE | Admit: 2010-09-25 | Discharge: 2010-09-25 | Disposition: A | Discharge status: Home / Self Care | Payer: Medicare Other | Source: Ambulatory Visit | Attending: Orthopedic Surgery | Admitting: Orthopedic Surgery

## 2010-09-25 ENCOUNTER — Other Ambulatory Visit (HOSPITAL_COMMUNITY): Payer: Self-pay | Admitting: Orthopedic Surgery

## 2010-09-25 ENCOUNTER — Encounter (HOSPITAL_COMMUNITY): Payer: Medicare Other

## 2010-09-25 DIAGNOSIS — Z01811 Encounter for preprocedural respiratory examination: Secondary | ICD-10-CM | POA: Insufficient documentation

## 2010-09-25 DIAGNOSIS — Z01812 Encounter for preprocedural laboratory examination: Secondary | ICD-10-CM | POA: Insufficient documentation

## 2010-09-25 DIAGNOSIS — M169 Osteoarthritis of hip, unspecified: Secondary | ICD-10-CM

## 2010-09-25 DIAGNOSIS — M1610 Unilateral primary osteoarthritis, unspecified hip: Secondary | ICD-10-CM | POA: Insufficient documentation

## 2010-09-25 DIAGNOSIS — M949 Disorder of cartilage, unspecified: Secondary | ICD-10-CM | POA: Insufficient documentation

## 2010-09-25 DIAGNOSIS — M899 Disorder of bone, unspecified: Secondary | ICD-10-CM | POA: Insufficient documentation

## 2010-09-25 DIAGNOSIS — Z01818 Encounter for other preprocedural examination: Secondary | ICD-10-CM | POA: Insufficient documentation

## 2010-09-25 LAB — URINE MICROSCOPIC-ADD ON

## 2010-09-25 LAB — CBC
HCT: 39.1 % (ref 36.0–46.0)
MCV: 90.1 fL (ref 78.0–100.0)
RBC: 4.34 MIL/uL (ref 3.87–5.11)
RDW: 12.8 % (ref 11.5–15.5)
WBC: 7.9 10*3/uL (ref 4.0–10.5)

## 2010-09-25 LAB — APTT: aPTT: 30 seconds (ref 24–37)

## 2010-09-25 LAB — COMPREHENSIVE METABOLIC PANEL
Albumin: 3.5 g/dL (ref 3.5–5.2)
Alkaline Phosphatase: 112 U/L (ref 39–117)
BUN: 23 mg/dL (ref 6–23)
Calcium: 9.5 mg/dL (ref 8.4–10.5)
Creatinine, Ser: 1.27 mg/dL — ABNORMAL HIGH (ref 0.4–1.2)
Glucose, Bld: 86 mg/dL (ref 70–99)
Total Protein: 7.9 g/dL (ref 6.0–8.3)

## 2010-09-25 LAB — PROTIME-INR
INR: 1.01 (ref 0.00–1.49)
Prothrombin Time: 13.5 seconds (ref 11.6–15.2)

## 2010-09-25 LAB — URINALYSIS, ROUTINE W REFLEX MICROSCOPIC
Nitrite: POSITIVE — AB
Specific Gravity, Urine: 1.03 (ref 1.005–1.030)
Urobilinogen, UA: 0.2 mg/dL (ref 0.0–1.0)
pH: 5 (ref 5.0–8.0)

## 2010-09-25 LAB — SURGICAL PCR SCREEN: MRSA, PCR: NEGATIVE

## 2010-10-01 ENCOUNTER — Inpatient Hospital Stay (HOSPITAL_COMMUNITY)
Admission: RE | Admit: 2010-10-01 | Discharge: 2010-10-03 | DRG: 470 | Disposition: A | Discharge status: Home Health | Payer: Medicare Other | Source: Ambulatory Visit | Attending: Orthopedic Surgery | Admitting: Orthopedic Surgery

## 2010-10-01 ENCOUNTER — Other Ambulatory Visit: Payer: Self-pay | Admitting: Orthopedic Surgery

## 2010-10-01 ENCOUNTER — Inpatient Hospital Stay (HOSPITAL_COMMUNITY): Payer: Medicare Other

## 2010-10-01 DIAGNOSIS — G309 Alzheimer's disease, unspecified: Secondary | ICD-10-CM | POA: Diagnosis present

## 2010-10-01 DIAGNOSIS — K589 Irritable bowel syndrome without diarrhea: Secondary | ICD-10-CM | POA: Diagnosis present

## 2010-10-01 DIAGNOSIS — Z79899 Other long term (current) drug therapy: Secondary | ICD-10-CM

## 2010-10-01 DIAGNOSIS — M810 Age-related osteoporosis without current pathological fracture: Secondary | ICD-10-CM | POA: Diagnosis present

## 2010-10-01 DIAGNOSIS — M169 Osteoarthritis of hip, unspecified: Principal | ICD-10-CM | POA: Diagnosis present

## 2010-10-01 DIAGNOSIS — Z8249 Family history of ischemic heart disease and other diseases of the circulatory system: Secondary | ICD-10-CM

## 2010-10-01 DIAGNOSIS — E7800 Pure hypercholesterolemia, unspecified: Secondary | ICD-10-CM | POA: Diagnosis present

## 2010-10-01 DIAGNOSIS — I8390 Asymptomatic varicose veins of unspecified lower extremity: Secondary | ICD-10-CM | POA: Diagnosis present

## 2010-10-01 DIAGNOSIS — Z6831 Body mass index (BMI) 31.0-31.9, adult: Secondary | ICD-10-CM

## 2010-10-01 DIAGNOSIS — M1610 Unilateral primary osteoarthritis, unspecified hip: Principal | ICD-10-CM | POA: Diagnosis present

## 2010-10-01 DIAGNOSIS — Z7982 Long term (current) use of aspirin: Secondary | ICD-10-CM

## 2010-10-01 DIAGNOSIS — E039 Hypothyroidism, unspecified: Secondary | ICD-10-CM | POA: Diagnosis present

## 2010-10-01 DIAGNOSIS — IMO0002 Reserved for concepts with insufficient information to code with codable children: Secondary | ICD-10-CM | POA: Diagnosis present

## 2010-10-01 DIAGNOSIS — I10 Essential (primary) hypertension: Secondary | ICD-10-CM | POA: Diagnosis present

## 2010-10-01 DIAGNOSIS — H3530 Unspecified macular degeneration: Secondary | ICD-10-CM | POA: Diagnosis present

## 2010-10-01 DIAGNOSIS — F0280 Dementia in other diseases classified elsewhere without behavioral disturbance: Secondary | ICD-10-CM | POA: Diagnosis present

## 2010-10-01 DIAGNOSIS — K219 Gastro-esophageal reflux disease without esophagitis: Secondary | ICD-10-CM | POA: Diagnosis present

## 2010-10-01 LAB — TYPE AND SCREEN: ABO/RH(D): O POS

## 2010-10-01 LAB — ABO/RH: ABO/RH(D): O POS

## 2010-10-02 LAB — BASIC METABOLIC PANEL
BUN: 15 mg/dL (ref 6–23)
Calcium: 8.3 mg/dL — ABNORMAL LOW (ref 8.4–10.5)
Creatinine, Ser: 1.01 mg/dL (ref 0.4–1.2)
GFR calc non Af Amer: 53 mL/min — ABNORMAL LOW (ref >60)
Glucose, Bld: 125 mg/dL — ABNORMAL HIGH (ref 70–99)

## 2010-10-02 LAB — CBC
Hemoglobin: 9.7 g/dL — ABNORMAL LOW (ref 12.0–15.0)
MCH: 29.8 pg (ref 26.0–34.0)
MCHC: 33.3 g/dL (ref 30.0–36.0)
Platelets: 196 10*3/uL (ref 150–400)

## 2010-10-02 LAB — PROTIME-INR
INR: 1.06 (ref 0.00–1.49)
Prothrombin Time: 14 seconds (ref 11.6–15.2)

## 2010-10-03 LAB — BASIC METABOLIC PANEL
BUN: 12 mg/dL (ref 6–23)
Calcium: 8.8 mg/dL (ref 8.4–10.5)
Chloride: 100 mEq/L (ref 96–112)
Creatinine, Ser: 0.98 mg/dL (ref 0.4–1.2)
GFR calc Af Amer: >60 mL/min (ref >60)
GFR calc non Af Amer: 55 mL/min — ABNORMAL LOW (ref >60)

## 2010-10-03 LAB — PROTIME-INR
INR: 1.38 (ref 0.00–1.49)
Prothrombin Time: 17.2 seconds — ABNORMAL HIGH (ref 11.6–15.2)

## 2010-10-03 LAB — CBC
MCHC: 33.3 g/dL (ref 30.0–36.0)
Platelets: 197 10*3/uL (ref 150–400)
RDW: 12.8 % (ref 11.5–15.5)
WBC: 9.6 10*3/uL (ref 4.0–10.5)

## 2010-10-03 NOTE — Op Note (Signed)
NAMEMALEIA, WEEMS NO.:  [id number]  MEDICAL RECORD NO.:  [id number]  LOCATION:  1603                         FACILITY:  [facility]  PHYSICIAN:  Ollen Gross, M.D.    DATE OF BIRTH:  02-14-1932  DATE OF PROCEDURE:  10/01/2010 DATE OF DISCHARGE:                              OPERATIVE REPORT   PREOPERATIVE DIAGNOSIS:  Osteoarthritis, right hip.  POSTOPERATIVE DIAGNOSIS:  Osteoarthritis, right hip.  PROCEDURE:  Right total hip arthroplasty.  SURGEON:  Ollen Gross, M.D.  ASSISTANT:  Avel Peace PA-C  ANESTHESIA:  General.  ESTIMATED BLOOD LOSS:  300 cc.  DRAINS:  Hemovac x1.  COMPLICATIONS:  None.  CONDITION:  Stable to recovery room.  CLINICAL NOTE:  Ms. Deangelo is a 77 year old female with advanced end- stage arthritis of the right hip with progressively worsening pain and dysfunction.  She has failed nonoperative management and presents for right total hip arthroplasty.  PROCEDURE IN DETAIL:  After successful administration of general anesthetic, the patient was placed in left lateral decubitus position with the right side up and held with the hip positioner.  Right lower extremity was isolated from the perineum with plastic drapes and prepped and draped in the usual sterile fashion.  Standard posterolateral incision was made through a very thick-layered subcutaneous tissue to the level of fascia lata, which was incised in line with the skin incision.  Sciatic nerve was palpated and protected and short rotators and capsule were isolated off the femur.  Capsulotomy was performed and the hip was dislocated.  The center of the femoral head was marked and a trial prosthesis was placed such that the center of the trial head corresponds to the center of the native femoral head.  Osteotomy line was marked on the femoral neck and osteotomy made with an oscillating saw.  Femoral head was removed.  Femoral retractors were placed for access to the  proximal femur.  The canal finder was placed and then the canal thoroughly irrigated with saline.  Axial reaming was performed up to 13.5 mm proximal reaming to an 18 F and sleeve machined to a large.  An 60 F large trial sleeve was placed.  Femur was retracted anteriorly to gain acetabular exposure.  Acetabular retractors were placed and labrum and osteophytes removed.  Acetabular reaming was performed up to 53 mm, replacement of 54 mm acetabular shell.  The Pinnacle shell was placed in anatomic position and transfixed with 2 dome screws.  Apex hole eliminator was placed and a 36 mm neutral +4 marathon liner placed.  We then placed a femoral trial, which was 18 x 13 with a 36+8 neck matching native anteversion.  36+0 trial head was placed.  Hips reduced with outstanding stability.  There was full extension, full external rotation, 70 degrees flexion, 40 degrees adduction, 90 degrees internal rotation, and 90 degrees of flexion, and 70 degrees of internal rotation.  I placed the right leg on top of the left and feels that her leg lengths were equal.  Hip was then dislocated and trials removed.  Permanent 18 F large sleeve was placed with the 18 x 13 stem with a 36+8 neck matching native anteversion.  The  36+0 head was placed and hips reduced with the same stability parameters.  The right leg was placed on top of the left and the leg lengths were equal.  Wounds copiously irrigated with saline solution and the short rotators and capsule reattached to the femur through drill holes with Ethibond suture.  Fascia lata was closed over Hemovac drain with interrupted #1 Vicryl, subcu closed with #1-0 and #2-0 Vicryl and subcuticular running 4-0 Monocryl.  Catheter for Marcaine pain pump was placed and pump is initiated.  Incisions cleaned and dried, and Steri- Strips and a bulky sterile dressing were applied.  She was then placed into a knee immobilizer, awakened and transported to Recovery in  stable condition.     Ollen Gross, M.D.     FA/MEDQ  D:  10/01/2010  T:  10/02/2010  Job:  [id number]  Electronically Signed by Ollen Gross M.D. on 10/03/2010 06:01:27 PM

## 2010-10-10 NOTE — H&P (Signed)
NAMEKANAI, HILGER NO.:  [id number]  MEDICAL RECORD NO.:  [id number]  LOCATION:  X002                         FACILITY:  [facility]  PHYSICIAN:  Ollen Gross, M.D.    DATE OF BIRTH:  08/29/1931  DATE OF ADMISSION:  10/01/2010 DATE OF DISCHARGE:                             HISTORY & PHYSICAL   CHIEF COMPLAINT:  Right hip pain.  HISTORY OF PRESENT ILLNESS:  The patient is a 77 year old female who has been seeing Dr. Lequita Halt for ongoing hip problems.  She has limited mobility.  She also has ongoing knee problems and also has arthritis there but it is felt lot of her leg pain is being referred down from the hip.  X-rays showed end-stage arthritis of the right hip and she is already getting collapse of the femoral head, worse, bone on bone.  It is felt she would benefit from undergoing surgical intervention.  Risks and benefits have been discussed.  She would like to proceed with surgery.  ALLERGIES:  No known drug allergies.  INTOLERANCES:  CODEINE causes nausea.  CURRENT MEDICATIONS:  Aleve, baby aspirin, calcium, Aricept, fluoxetine, glucosamine chondroitin, levothyroxine, meclizine, Namenda, omeprazole, Pravachol, promethazine, triamterene/hydrochlorothiazide, vitamin D, vitamin A and Vicodin.  PAST MEDICAL HISTORY: 1. Vertigo. 2. Early Alzheimer. 3. Macular degeneration. 4. Hypertension. 5. Hypercholesterolemia. 6. Varicose veins. 7. Hiatal hernia. 8. Reflux disease. 9. History of gastritis. 10.Irritable bowel syndrome. 11.Hypothyroidism. 12.Past history of lung cancer. 13.Osteoporosis. 14.Degenerative disk disease.  PAST SURGICAL HISTORY:  Hysterectomy, appendectomy, back surgery x3, thyroid surgery, lung surgery for cancer, gallbladder surgery, knee surgery.  FAMILY HISTORY:  Father with heart problems, heart attack and stomach issues.  Mother with stroke.  She has 7 siblings, all with heart problems.  SOCIAL HISTORY:  Married, past  smoker.  No alcohol.  Her daughter will be assisting with care after surgery.  She has 3 steps entering her home.  REVIEW OF SYSTEMS:  GENERAL:  No fevers, chills or night sweats.  NEURO: She does have a little bit memory loss with a history of early Alzheimer.  Otherwise, no seizures, syncope, or paralysis.  RESPIRATORY: No shortness breath, productive cough, or hemoptysis.  CARDIOVASCULAR: No chest pain, angina, or orthopnea.  GI:  Little bit of reflux. History of gastritis.  No nausea, vomiting, diarrhea, or constipation. GU:  Little bit of discharge.  Weak stream.  No dysuria, hematuria. MUSCULOSKELETAL: Hip pain.  PHYSICAL EXAMINATION:  VITAL SIGNS:  Pulse 80, respirations 12, blood pressure 122/66. GENERAL:  A 77 year old female, well nourished, well developed, somewhat of a flat affect.  She is accompanied by her daughter. HEENT:  Normocephalic, atraumatic.  Pupils round and reactive.  EOMs intact. NECK:  Supple.  No carotid bruits. CHEST:  Clear.  Clear anterior and posterior chest walls.  No rhonchi, rales or wheezing. HEART:  Regular rate and rhythm with a faint early systolic ejection murmur. ABDOMEN:  Soft, slightly round.  Bowel sounds present. RECTAL:  Not done, not pertinent to present illness. BREASTS:  Not done, not pertinent to present illness. GENITALIA:  Not done, not pertinent to present illness. EXTREMITIES:  Right hip, flexion 90, 0 internal rotation, 0 external rotation with about 10  degrees abduction.  Right knee range of motion 5 to 120.  Medial joint line pain, no instability.  IMPRESSION:  Osteoarthritis, right hip.  PLAN:  The patient will be admitted to [facility] to undergo right total hip replacement arthroplasty.  Surgery will be performed by Dr. Ollen Gross.     Alexzandrew L. Julien Girt, P.A.C.   ______________________________ Ollen Gross, M.D.    ALP/MEDQ  D:  10/01/2010  T:  10/01/2010  Job:  [id number]  cc:   Belva Agee,  MD Fax: [phone]  Electronically Signed by Patrica Duel P.A.C. on 10/04/2010 10:44:37 AM Electronically Signed by Ollen Gross M.D. on 10/10/2010 03:24:14 PM

## 2010-10-22 ENCOUNTER — Other Ambulatory Visit: Payer: Self-pay | Admitting: Thoracic Surgery

## 2010-10-22 DIAGNOSIS — C3410 Malignant neoplasm of upper lobe, unspecified bronchus or lung: Secondary | ICD-10-CM

## 2010-10-23 ENCOUNTER — Encounter: Payer: Medicare Other | Admitting: Thoracic Surgery

## 2010-11-09 NOTE — Discharge Summary (Addendum)
NAMETANEAL, SONNTAG NO.:  [id number]  MEDICAL RECORD NO.:  [id number]  LOCATION:  1603                         FACILITY:  [facility]  PHYSICIAN:  Ollen Gross, M.D.    DATE OF BIRTH:  1932/02/27  DATE OF ADMISSION:  10/01/2010 DATE OF DISCHARGE:  10/03/2010                              DISCHARGE SUMMARY   ADMITTING DIAGNOSES: 1. Osteoarthritis, right hip. 2. Vertigo. 3. Early Alzheimer's. 4. Macular degeneration. 5. Hypertension. 6. Hypercholesterolemia. 7. Varicose veins. 8. Hiatal hernia. 9. Reflux disease. 10.History of gastritis. 11.Irritable bowel syndrome. 12.Hypothyroidism. 13.Past history of lung cancer. 14.Osteoporosis. 15.Degenerative disk disease.  DISCHARGE DIAGNOSES: 1. Osteoarthritis, right hip, status post right total hip replacement     arthroplasty. 2. Mild postop hyponatremia. 3. Vertigo. 4. Early Alzheimer's. 5. Macular degeneration. 6. Hypertension. 7. Hypercholesterolemia. 8. Varicose veins. 9. Hiatal hernia. 10.Reflux disease. 11.History of gastritis. 12.Irritable bowel syndrome. 13.Hypothyroidism. 14.Past history of lung cancer. 15.Osteoporosis. 16.Degenerative disk disease.  PROCEDURE:  October 01, 2010, right total hip.  Surgeon, Dr. Lequita Halt. Assistant, Alexzandrew L. Perkins, P.A.C.  Anesthesia was general.  CONSULTS:  None.  BRIEF HISTORY:  The patient is a 77 year old female with advanced arthritis of the right knee with progressively worsening pain and dysfunction, failed nonoperative management, now presents for total hip arthroplasty.  LABORATORY DATA:  Admission CBC showed hemoglobin of 12.9, drifted down to 9.7 where it stabilized.  Last noted H and H of 9.9 and 29.7.  Serial protime followed per Coumadin protocol.  Last noted PT/INR of 17.2 and 1.36.  Chem panel on admission not scanned in the chart, not available for dictation but serial BMETs were followed for 48 hours.  Sodium was 133 on postop day #1  where it stabilized and remained at 133.  Remaining electrolytes remained within normal limits.  Blood group type O+.  X-rays, pelvis film and right hip film shows interval total hip arthroplasty.  HOSPITAL COURSE:  The patient admitted to [facility], taken to OR, underwent above-stated procedure without complication.  The patient tolerated the procedure well, later transferred to the recovery room on orthopedic floor, started on p.o. and IV analgesics, given 24 hours postop IV antibiotics, started on Coumadin for DVT prophylaxis.  The patient has done pretty well on the morning of day #1, had good excellent urinary output.  Sodium was down a little bit, felt to be a delusional component.  She was on fluid pills for blood pressure meds. She was started back on all of her medications.  Hemoglobin was 9.7. Started getting up out of bed and did very well with physical therapy on day #1.  By day #2, hemoglobin was stable at 9.9.  Sodium was also stable.  The patient was progressing well with therapy, meeting all of her goals and was discharged home later that day.  DISCHARGE/PLAN: 1. The patient discharged home on October 03, 2010. 2. Discharge diagnoses:  Please see above. 3. Discharge meds:  Robaxin, OxyIR, Coumadin.  Continue amlodipine,     donepezil, fluoxetine, levothyroxine, meclizine, Namenda,     omeprazole, Pravachol, promethazine,     triamterene/hydrochlorothiazide.  DIET:  Heart healthy diet.  ACTIVITY:  She is partial weightbearing,  25-50%.  Hip precautions, total hip protocol.  FOLLOW-UP:  Two weeks.  DISPOSITION:  Home.  CONDITION ON DISCHARGE:  Improved.     Alexzandrew L. Julien Girt, P.A.C.   ______________________________ Ollen Gross, M.D.   ALP/MEDQ  D:  11/08/2010  T:  11/08/2010  Job:  [id number]  Electronically Signed by Patrica Duel P.A.C. on 11/09/2010 09:36:45 AM Electronically Signed by Ollen Gross M.D. on 11/12/2010 06:50:56 AM

## 2011-01-24 ENCOUNTER — Other Ambulatory Visit: Payer: Self-pay | Admitting: Thoracic Surgery

## 2011-01-24 ENCOUNTER — Encounter: Payer: Self-pay | Admitting: Thoracic Surgery

## 2011-01-24 DIAGNOSIS — C3490 Malignant neoplasm of unspecified part of unspecified bronchus or lung: Secondary | ICD-10-CM

## 2011-01-29 ENCOUNTER — Ambulatory Visit (INDEPENDENT_AMBULATORY_CARE_PROVIDER_SITE_OTHER): Payer: Medicare Other | Admitting: Thoracic Surgery

## 2011-01-29 ENCOUNTER — Encounter: Payer: Self-pay | Admitting: Thoracic Surgery

## 2011-01-29 ENCOUNTER — Ambulatory Visit
Admission: RE | Admit: 2011-01-29 | Discharge: 2011-01-29 | Disposition: A | Discharge status: Home / Self Care | Payer: Medicare Other | Source: Ambulatory Visit | Attending: Thoracic Surgery | Admitting: Thoracic Surgery

## 2011-01-29 VITALS — BP 115/67 | HR 92 | Resp 18 | Ht 66.0 in | Wt 193.0 lb

## 2011-01-29 DIAGNOSIS — C3490 Malignant neoplasm of unspecified part of unspecified bronchus or lung: Secondary | ICD-10-CM

## 2011-01-29 NOTE — Progress Notes (Signed)
HPI the patient returns for followup 9 months since her left upper lobe apical posterior segmentectomy. Chest x-ray shows no evidence of recurrence and normal postoperative changes. She is doing well overall. She was seen in March by the medical oncologist. I will see her in 4 months with a CT scan and then refer her back to the medical oncologist.   Current Outpatient Prescriptions  Medication Sig Dispense Refill  . aspirin 81 MG tablet Take 81 mg by mouth daily.        . Calcium Carbonate (CALCIUM 600 PO) Take by mouth. 1 po daily       . Cholecalciferol (VITAMIN D) 1000 UNITS capsule Take 1,000 Units by mouth daily.        Marland Kitchen donepezil (ARICEPT) 10 MG tablet Take 10 mg by mouth at bedtime as needed.        Marland Kitchen FLUoxetine (PROZAC) 40 MG capsule Take 40 mg by mouth daily.        Marland Kitchen glucosamine-chondroitin 500-400 MG tablet Take 1 tablet by mouth daily.        Marland Kitchen levothyroxine (SYNTHROID, LEVOTHROID) 100 MCG tablet Take 100 mcg by mouth.       . meclizine (ANTIVERT) 25 MG tablet Take 25 mg by mouth as needed.        . memantine (NAMENDA) 10 MG tablet Take 10 mg by mouth 2 (two) times daily.        Marland Kitchen omeprazole (PRILOSEC) 20 MG capsule Take 20 mg by mouth 2 (two) times daily.        . promethazine (PHENERGAN) 25 MG tablet Take 25 mg by mouth every 6 (six) hours as needed.        . triamterene-hydrochlorothiazide (MAXZIDE-25) 37.5-25 MG per tablet Take 1 tablet by mouth daily.        . vitamin E 400 UNIT capsule Take 400 Units by mouth daily.           Review of Systems: No change  Physical Exam  Cardiovascular: Normal rate, normal heart sounds and intact distal pulses.   Pulmonary/Chest: Effort normal and breath sounds normal. No respiratory distress.     Diagnostic Tests: Chest x-ray shows no evidence of recurrence or cancer in normal postoperative changes   Impression: Status post segmentectomy for stageIb non-small cell lung cancer   Plan: Return in 4 months with a CT scan of the  chest and then refer her to medical oncologist

## 2011-04-08 ENCOUNTER — Other Ambulatory Visit: Payer: Self-pay | Admitting: Thoracic Surgery

## 2011-04-08 DIAGNOSIS — D381 Neoplasm of uncertain behavior of trachea, bronchus and lung: Secondary | ICD-10-CM

## 2011-05-21 ENCOUNTER — Ambulatory Visit: Payer: Medicare Other | Admitting: Thoracic Surgery

## 2011-05-21 ENCOUNTER — Inpatient Hospital Stay
Admission: RE | Admit: 2011-05-21 | Discharge: 2011-05-21 | Payer: Medicare Other | Source: Ambulatory Visit | Attending: Thoracic Surgery | Admitting: Thoracic Surgery

## 2011-05-27 ENCOUNTER — Ambulatory Visit
Admission: RE | Admit: 2011-05-27 | Discharge: 2011-05-27 | Disposition: A | Discharge status: Home / Self Care | Payer: Medicare Other | Source: Ambulatory Visit | Attending: Thoracic Surgery | Admitting: Thoracic Surgery

## 2011-05-27 ENCOUNTER — Encounter: Payer: Self-pay | Admitting: Thoracic Surgery

## 2011-05-27 ENCOUNTER — Ambulatory Visit (INDEPENDENT_AMBULATORY_CARE_PROVIDER_SITE_OTHER): Payer: Medicare Other | Admitting: Thoracic Surgery

## 2011-05-27 VITALS — BP 144/68 | HR 67 | Resp 16 | Ht 66.0 in | Wt 182.8 lb

## 2011-05-27 DIAGNOSIS — Z09 Encounter for follow-up examination after completed treatment for conditions other than malignant neoplasm: Secondary | ICD-10-CM

## 2011-05-27 DIAGNOSIS — D381 Neoplasm of uncertain behavior of trachea, bronchus and lung: Secondary | ICD-10-CM

## 2011-05-27 DIAGNOSIS — Z85118 Personal history of other malignant neoplasm of bronchus and lung: Secondary | ICD-10-CM

## 2011-05-27 NOTE — Progress Notes (Signed)
HPI returns for followup. She is one year since her surgery. CT scan showed no evidence of recurrence. She complains of some right back pain. Lungs are clear attestation percussion. I'll refer her back to her medical oncologist for long-term followup   Current Outpatient Prescriptions  Medication Sig Dispense Refill  . aspirin 81 MG tablet Take 81 mg by mouth daily.        . Calcium Carbonate (CALCIUM 600 PO) Take by mouth. 1 po daily       . Cholecalciferol (VITAMIN D) 1000 UNITS capsule Take 1,000 Units by mouth daily.        Marland Kitchen donepezil (ARICEPT) 10 MG tablet Take 10 mg by mouth at bedtime as needed.        Marland Kitchen FLUoxetine (PROZAC) 40 MG capsule Take 40 mg by mouth daily.        Marland Kitchen glucosamine-chondroitin 500-400 MG tablet Take 1 tablet by mouth daily.        . hydrocodone-acetaminophen (LORCET-HD) 5-500 MG per capsule Take 1 capsule by mouth every 6 (six) hours as needed.      Marland Kitchen levothyroxine (SYNTHROID, LEVOTHROID) 100 MCG tablet Take 100 mcg by mouth.       . meclizine (ANTIVERT) 25 MG tablet Take 25 mg by mouth as needed.        . memantine (NAMENDA) 10 MG tablet Take 10 mg by mouth 2 (two) times daily.        Marland Kitchen omeprazole (PRILOSEC) 20 MG capsule Take 20 mg by mouth 2 (two) times daily.        . promethazine (PHENERGAN) 25 MG tablet Take 25 mg by mouth every 6 (six) hours as needed.        . triamterene-hydrochlorothiazide (MAXZIDE-25) 37.5-25 MG per tablet Take 1 tablet by mouth daily.        . vitamin E 400 UNIT capsule Take 400 Units by mouth daily.           Review of Systems: Unchanged except for a right back in   Physical Exam lungs are clear attestation percussion.   Diagnostic Tests: CT scan showed no evidence of recurrence of cancer   Impression: Status post dissection for stage Ib left upper lobe cancer   Plan: Return as needed

## 2011-05-28 ENCOUNTER — Ambulatory Visit: Payer: Medicare Other | Admitting: Thoracic Surgery

## 2011-05-29 ENCOUNTER — Ambulatory Visit: Payer: Medicare Other | Admitting: Thoracic Surgery

## 2011-05-29 ENCOUNTER — Other Ambulatory Visit: Payer: Medicare Other

## 2011-12-09 ENCOUNTER — Encounter: Payer: Medicare Other | Admitting: Internal Medicine

## 2011-12-09 DIAGNOSIS — F0390 Unspecified dementia without behavioral disturbance: Secondary | ICD-10-CM

## 2011-12-09 DIAGNOSIS — D509 Iron deficiency anemia, unspecified: Secondary | ICD-10-CM

## 2011-12-09 DIAGNOSIS — C3490 Malignant neoplasm of unspecified part of unspecified bronchus or lung: Secondary | ICD-10-CM

## 2011-12-26 ENCOUNTER — Encounter: Payer: Medicare Other | Admitting: Internal Medicine

## 2011-12-26 DIAGNOSIS — C3490 Malignant neoplasm of unspecified part of unspecified bronchus or lung: Secondary | ICD-10-CM

## 2011-12-26 DIAGNOSIS — D649 Anemia, unspecified: Secondary | ICD-10-CM

## 2012-01-06 DIAGNOSIS — C3490 Malignant neoplasm of unspecified part of unspecified bronchus or lung: Secondary | ICD-10-CM

## 2012-01-06 DIAGNOSIS — N189 Chronic kidney disease, unspecified: Secondary | ICD-10-CM

## 2012-01-06 DIAGNOSIS — D649 Anemia, unspecified: Secondary | ICD-10-CM

## 2012-01-13 DIAGNOSIS — N189 Chronic kidney disease, unspecified: Secondary | ICD-10-CM

## 2012-01-13 DIAGNOSIS — C3490 Malignant neoplasm of unspecified part of unspecified bronchus or lung: Secondary | ICD-10-CM

## 2012-01-13 DIAGNOSIS — D509 Iron deficiency anemia, unspecified: Secondary | ICD-10-CM

## 2012-01-20 DIAGNOSIS — D649 Anemia, unspecified: Secondary | ICD-10-CM

## 2012-01-20 DIAGNOSIS — N189 Chronic kidney disease, unspecified: Secondary | ICD-10-CM

## 2012-01-20 DIAGNOSIS — C3490 Malignant neoplasm of unspecified part of unspecified bronchus or lung: Secondary | ICD-10-CM

## 2012-02-10 DIAGNOSIS — N189 Chronic kidney disease, unspecified: Secondary | ICD-10-CM

## 2012-02-10 DIAGNOSIS — C3490 Malignant neoplasm of unspecified part of unspecified bronchus or lung: Secondary | ICD-10-CM

## 2012-05-12 ENCOUNTER — Encounter: Payer: Medicare Other | Admitting: Internal Medicine

## 2012-05-12 DIAGNOSIS — C3490 Malignant neoplasm of unspecified part of unspecified bronchus or lung: Secondary | ICD-10-CM

## 2012-05-12 DIAGNOSIS — D509 Iron deficiency anemia, unspecified: Secondary | ICD-10-CM

## 2012-08-03 ENCOUNTER — Other Ambulatory Visit: Payer: Self-pay | Admitting: Neurology

## 2012-08-10 ENCOUNTER — Telehealth: Payer: Self-pay | Admitting: *Deleted

## 2012-08-10 NOTE — Telephone Encounter (Signed)
patient is having medication questions

## 2012-08-11 NOTE — Telephone Encounter (Signed)
Patient  Schedule , confirmed with daughter, will bring form to be completed to continue aricept per her insurance company, informed there is a form fee for this as well, daughter understood.

## 2012-09-09 ENCOUNTER — Other Ambulatory Visit: Payer: Self-pay | Admitting: Neurology

## 2012-09-16 ENCOUNTER — Ambulatory Visit (INDEPENDENT_AMBULATORY_CARE_PROVIDER_SITE_OTHER): Payer: Medicare Other | Admitting: Neurology

## 2012-09-16 ENCOUNTER — Encounter: Payer: Self-pay | Admitting: Neurology

## 2012-09-16 VITALS — BP 122/66 | HR 76 | Ht 63.0 in | Wt 196.0 lb

## 2012-09-16 DIAGNOSIS — G309 Alzheimer's disease, unspecified: Secondary | ICD-10-CM

## 2012-09-16 DIAGNOSIS — C3491 Malignant neoplasm of unspecified part of right bronchus or lung: Secondary | ICD-10-CM

## 2012-09-16 DIAGNOSIS — F0280 Dementia in other diseases classified elsewhere without behavioral disturbance: Secondary | ICD-10-CM

## 2012-09-16 DIAGNOSIS — C3490 Malignant neoplasm of unspecified part of unspecified bronchus or lung: Secondary | ICD-10-CM

## 2012-09-16 MED ORDER — MEMANTINE HCL 10 MG PO TABS: 10.0000 mg | ORAL_TABLET | Freq: Two times a day (BID) | ORAL | Status: DC

## 2012-09-16 MED ORDER — DONEPEZIL HCL 10 MG PO TABS: 10.0000 mg | ORAL_TABLET | Freq: Two times a day (BID) | ORAL | Status: DC

## 2012-09-16 MED ORDER — DONEPEZIL HCL 23 MG PO TABS: 23.0000 mg | ORAL_TABLET | Freq: Every day | ORAL | Status: DC

## 2012-09-16 NOTE — Addendum Note (Signed)
Addended by: Melvyn Novas on: 09/16/2012 02:43 PM   Modules accepted: Orders

## 2012-09-16 NOTE — Patient Instructions (Addendum)
Alzheimer's Disease Alzheimer's disease is a breaking down of the brain that sometimes happens with aging. It often affects memory, thinking, and speaking. It also affects how you get along with people and job performance. Often the changes come on slowly and are not very noticeable. The changes may be silent and hidden for a long time, even from family and friends. Mood and personality changes often cause the family to seek medical care. CAUSES  Alzheimer's disease is the leading cause of dementia. Dementia is a reduced ability of the working of the brain. Alzheimer's is one type of dementia and is caused by small changes in the brain. There are many causes of Alzheimer's disease. DIAGNOSIS  There are no specific tests for Alzheimer's disease, other than doing a biopsy(tissue sample) of the brain. This is not usually done. Physical examination and history often provide the best clues for a diagnosis. Often the diagnosis may be made over time, with more observation.  TREATMENT  There is no specific treatment for Alzheimer's disease. Your caregivers will discuss the particular problems you are dealing with or may deal with. They can help direct you to other caregivers who can help in the care of Alzheimer's patients.  Document Released: 01/23/2005 Document Revised: 07/08/2011 Document Reviewed: 09/15/2006 [facility] Patient Information 2014 [place], [place].

## 2012-09-16 NOTE — Progress Notes (Signed)
[facility]  Provider:  Dr Vickey Huger Referring Provider: Mathis Fare, OTR Primary Care Physician:  Mathis Fare, OTR  Chief Complaint  Patient presents with  . Memory Loss    RM#10    HPI:  Suzanne Fritz is a 79 y.o. female here as a follow up , from Dr.  Christell Constant- for dementia workup. In the very first and encounter in  2011 we reviewed a Mini-Mental Status Examination and at that time the patient scored MMSE  23 points, she also fell  Asleep.  Frequently ,she fell asleep here - showed that she was excessively daytime sleepy- the Epworth score  varied between 17 and 24 points.   In her first visit at chest CT was reviewed which showed a 2 x 2 centimeter lesion nodular in her left upper pulmonary lobe. This was attributed to a severe recurrent bronchitis and probably there was COPD 2 the patient is on inhalers and has used steroids from time to time, she has a history of hypothyroidism, she also underwent a sleep study for her severe hypersomnia on 05/11/2010 there was no evidence of REM behavior disorder during that sleep study noted. The patient had an apnea HI of 0.9,  did not reqiere  any intervention. She now will need  a  Brain imaging  , given the severe  progression of what was thought to be a milder form of dementia.   MMSE on 09-16-12  13 -30 points.  Patient's older sister passed away from an advanced form of dementia last month, she was 52.       Review of Systems: Out of a complete 14 system review, the patient complains of only the following symptoms, and all other reviewed systems are negative. None per patient.   History   Social History  . Marital Status: Married    Spouse Name: N/A    Number of Children: 3  . Years of Education: N/A   Occupational History  . Retired    Social History Main Topics  . Smoking status: Former Smoker    Quit date: 04/29/1974  . Smokeless tobacco: Not on file  . Alcohol Use: No  . Drug Use: No  .  Sexually Active: Not on file   Other Topics Concern  . Not on file   Social History Narrative    She is married, three children, retired. ( 62, 60 and 55 ) two sons and one daughter.   Quit smoking over  36     years ago.  Does not drink alcohol on a regular basis.  Retired form  Educational psychologist at age 38.    Family History  Problem Relation Age of Onset  . Stroke Mother 36  . Cancer Father 53    Stomach  . Coronary artery disease Brother     2 brothers , but alive  . Migraines Child     Past Medical History  Diagnosis Date  . Hypercholesterolemia   . Depression   . Hypertension   . GERD (gastroesophageal reflux disease)   . Peripheral neuropathy   . Dementia   . Hypothyroidism   . Adenocarcinoma   . Lung cancer 04/2010    upper lobe removed   . Alzheimer disease   . Arthritis   . Back pain   . Hyperlipidemia   . Migraine   . Behavior disorder     REM  . Osteoarthritis     RIGHT KNEE  . Syncope and collapse  02/2010    HOSPITALIZED  . Anxiety     STRESS AND MENOPAUSAL SYMPTOMS  . Memory loss   . Hearing loss   . Memory problem   . Hearing difficulty     DECREASED HEARING    Past Surgical History  Procedure Laterality Date  . Left upper lobe resection  04/2010  . Appendectomy    . Thyroidectomy    . Cholecystectomy    . Vaginal hysterectomy    . Hemorrhoid surgery    . Back surgery    . Knee surgery      RIGHT SIDE    Current Outpatient Prescriptions  Medication Sig Dispense Refill  . aspirin 81 MG tablet Take 81 mg by mouth daily.        . Calcium Carbonate (CALCIUM 600 PO) Take by mouth. 1 po daily       . Cholecalciferol (VITAMIN D) 1000 UNITS capsule Take 1,000 Units by mouth daily.        . diazepam (VALIUM) 5 MG tablet Take 5 mg by mouth 2 (two) times daily. 1/2 two times daily in afternoon      . diclofenac (VOLTAREN) 75 MG EC tablet Take 75 mg by mouth daily.      Marland Kitchen donepezil (ARICEPT) 23 MG TABS tablet Take 1 tablet (23 mg total)  by mouth daily.  30 tablet  0  . FLUoxetine (PROZAC) 40 MG capsule Take 40 mg by mouth daily.        Marland Kitchen gabapentin (NEURONTIN) 300 MG capsule Take 300 mg by mouth daily.      Marland Kitchen glucosamine-chondroitin 500-400 MG tablet Take 1 tablet by mouth daily.        . hydrocodone-acetaminophen (LORCET-HD) 5-500 MG per capsule Take 1 capsule by mouth 2 (two) times daily.       Marland Kitchen L-Methylfolate-B12-B6-B2 (CEREFOLIN) 09-27-48-5 MG TABS Take by mouth daily.      Marland Kitchen levothyroxine (SYNTHROID, LEVOTHROID) 100 MCG tablet Take 100 mcg by mouth.       . meclizine (ANTIVERT) 25 MG tablet Take 25 mg by mouth as needed.        . memantine (NAMENDA) 10 MG tablet Take 10 mg by mouth 2 (two) times daily.        Marland Kitchen omeprazole (PRILOSEC) 20 MG capsule Take 20 mg by mouth 2 (two) times daily.        . promethazine (PHENERGAN) 25 MG tablet Take 25 mg by mouth every 6 (six) hours as needed.        . triamterene-hydrochlorothiazide (MAXZIDE-25) 37.5-25 MG per tablet Take 1 tablet by mouth daily.        . vitamin E 100 UNIT capsule Take 100 Units by mouth 2 (two) times daily.       No current facility-administered medications for this visit.    Allergies as of 09/16/2012 - Review Complete 09/16/2012  Allergen Reaction Noted  . Codeine  09/11/2010    Vitals: BP 122/66  Pulse 76  Ht 5\' 3"  (1.6 m)  Wt 196 lb (88.905 kg)  BMI 34.73 kg/m2 Last Weight:  Wt Readings from Last 1 Encounters:  09/16/12 196 lb (88.905 kg)   Last Height:   Ht Readings from Last 1 Encounters:  09/16/12 5\' 3"  (1.6 m)   Vision Screening:  Physical exam:  General: The patient is awake, alert and appears not in acute distress. The patient is well groomed.  She has a history of progressive memory loss as well as migraine and  REM behavior sleep disorder she endorsed the Epworth sleepiness score of 22 points and score today on an and MMSE13 out of 30 points.  Head: Normocephalic, atraumatic. Neck is supple. Cardiovascular:  Regular rate and rhythm,  without  murmurs or carotid bruit, and without distended neck veins. Respiratory: Lungs are clear to auscultation. Skin:  Without evidence of edema, or rash Trunk: BMI is elevated and patient  has normal posture.  Neurologic exam : The patient is awake and alert, oriented to place and time.  Memory  Severely disturbed -. There is a normal attention span & concentration ability, but she forgets quickly. Marland Kitchen Speech is fluent without dysarthria, dysphonia or aphasia. Mood and affect are appropriate.  Cranial nerves: Pupils are equal and briskly reactive to light.Extraocular movements  in vertical and horizontal planes intact and without nystagmus. Visual fields by finger perimetry are intact. Hearing to finger rub intact.  Facial sensation intact to fine touch. Facial motor strength is symmetric and tongue and uvula move midline.  Motor exam: Normal tone and normal muscle bulk and symmetric normal strength in all extremities.  Sensory:  Fine touch, pinprick and vibration were tested in all extremities. Proprioception is tested in the upper extremities only. - normal.  Coordination: Rapid alternating movements in the fingers/hands is tested and normal. Finger-to-nose maneuver tested and normal without evidence of ataxia, dysmetria or tremor.  Gait and station: Patient walks without assistive device and is able unassistedto climb up to the exam table. Strength within normal limits. Stance is stable and normal. Tandem gaitunfragmented. Romberg testing is normal.  Deep tendon reflexes: in the  upper and lower extremities are symmetric and intact. Babinski maneuver response is downgoing.   Assessment:  After physical and neurologic examination, review of laboratory studies, imaging, neurophysiology testing and pre-existing records, assessment will be reviewed on the problem list.  Plan:  Treatment plan and additional workup will be reviewed under Problem List.  This patient, with otherwise an  excellent physical health, presents with progressive memory loss. When we evaluated her in the past for hypersomnia, there was no evidence of sleep disordered breathing found and neither at least not for the night of testing was there any parasomnia noted. The patient continues to complain of excessive daytime sleepiness and very irregular sleep wake rhythms and daytime naps. Her memory tests has progressively squat lower end January 2013. 17 points on the M.M.S.E and to date 13 points.  The first MSSE test scored 23/30 points in early 2011, and  in December 2011 a brain MRI was obtained which showed nor abnormalities just some generalized brain atrophy. Her  labs were indicating a  renal insufficiency, and her chest CT at that time showed a concerning 2 centimeter by 2 cm lesion that had been followed up with a PET scan her pulmonologist declared cancerous . Surgery followed and  Pulmonology  is regularly following that.   The patient is not longer driving, after getting lost on a familiar way to visit her sister.   She is taking 23 mg of Aricept and tolerated this medication rather well. She is already taking Namenda 10 mg twice a day as well. Dr. Reginold Agent prescribes Hydrocodon twice a day for pain. She continues on the generic form of Prozac, namely fluoxetine 40 mg a day. I do not suspect a paraneoplastic impact on her memory loss. He also did not undergo radiation or chemotherapy.  I strongly suspect that this is a progressive form of an inherited dementia, likely Alzheimer's  disease.

## 2012-11-19 DIAGNOSIS — D509 Iron deficiency anemia, unspecified: Secondary | ICD-10-CM

## 2012-11-19 DIAGNOSIS — R42 Dizziness and giddiness: Secondary | ICD-10-CM

## 2012-11-19 DIAGNOSIS — E039 Hypothyroidism, unspecified: Secondary | ICD-10-CM

## 2012-11-19 DIAGNOSIS — I10 Essential (primary) hypertension: Secondary | ICD-10-CM

## 2012-11-19 DIAGNOSIS — C3490 Malignant neoplasm of unspecified part of unspecified bronchus or lung: Secondary | ICD-10-CM

## 2012-11-26 DIAGNOSIS — D509 Iron deficiency anemia, unspecified: Secondary | ICD-10-CM

## 2012-12-03 DIAGNOSIS — D509 Iron deficiency anemia, unspecified: Secondary | ICD-10-CM

## 2013-01-21 DIAGNOSIS — D509 Iron deficiency anemia, unspecified: Secondary | ICD-10-CM

## 2013-02-22 DIAGNOSIS — D509 Iron deficiency anemia, unspecified: Secondary | ICD-10-CM

## 2013-02-22 DIAGNOSIS — C3490 Malignant neoplasm of unspecified part of unspecified bronchus or lung: Secondary | ICD-10-CM

## 2013-03-01 ENCOUNTER — Other Ambulatory Visit: Payer: Self-pay | Admitting: Neurology

## 2013-03-18 ENCOUNTER — Ambulatory Visit (INDEPENDENT_AMBULATORY_CARE_PROVIDER_SITE_OTHER): Payer: Medicare Other

## 2013-03-18 ENCOUNTER — Other Ambulatory Visit: Payer: Self-pay | Admitting: Orthopedic Surgery

## 2013-03-18 DIAGNOSIS — R52 Pain, unspecified: Secondary | ICD-10-CM

## 2013-03-18 DIAGNOSIS — M25569 Pain in unspecified knee: Secondary | ICD-10-CM

## 2013-04-20 ENCOUNTER — Encounter (INDEPENDENT_AMBULATORY_CARE_PROVIDER_SITE_OTHER): Payer: Self-pay

## 2013-04-20 ENCOUNTER — Encounter: Payer: Self-pay | Admitting: Neurology

## 2013-04-20 ENCOUNTER — Ambulatory Visit (INDEPENDENT_AMBULATORY_CARE_PROVIDER_SITE_OTHER): Payer: Medicare Other | Admitting: Neurology

## 2013-04-20 VITALS — BP 171/72 | HR 55 | Resp 17 | Ht 64.0 in | Wt 201.0 lb

## 2013-04-20 DIAGNOSIS — C3491 Malignant neoplasm of unspecified part of right bronchus or lung: Secondary | ICD-10-CM

## 2013-04-20 DIAGNOSIS — F0280 Dementia in other diseases classified elsewhere without behavioral disturbance: Secondary | ICD-10-CM

## 2013-04-20 DIAGNOSIS — C3490 Malignant neoplasm of unspecified part of unspecified bronchus or lung: Secondary | ICD-10-CM

## 2013-04-20 MED ORDER — MEMANTINE HCL 10 MG PO TABS: 10.0000 mg | ORAL_TABLET | Freq: Two times a day (BID) | ORAL | Status: DC

## 2013-04-20 MED ORDER — DONEPEZIL HCL 10 MG PO TABS: 10.0000 mg | ORAL_TABLET | Freq: Two times a day (BID) | ORAL | Status: DC

## 2013-04-20 NOTE — Patient Instructions (Signed)
Dementia Dementia is a word that is used to describe problems with the brain and how it works. People with dementia have memory loss. They may also have problems with thinking, speaking, or solving problems. It can affect how they act around people, how they do their job, their mood, and their personality. These changes may not show up for a long time. Family or friends may not notice problems in the early part of this disease. HOME CARE The following tips are for the person living with, or caring for, the person with dementia. Make the home safe.  Remove locks on bathroom doors.  Use childproof locks on cabinets where alcohol, cleaning supplies, or chemicals are stored.  Put outlet covers in electrical outlets.  Put in childproof locks to keep doors and windows safe.  Remove stove knobs, or put in safety knobs that shut off on their own.  Lower the temperature on water heaters.  Label medicines. Lock them in a safe place.  Keep knives, lighters, matches, power tools, and guns out of reach or in a safe place.  Remove objects that might break or can hurt the person.  Make sure lighting is good inside and outside.  Put in grab bars if needed.  Use a device that detects falls or other needs for help. Lessen confusion.  Keep familiar objects and people around.  Use night lights or low lit (dim) lights at night.  Label objects or areas.  Use reminders, notes, or directions for daily activities or tasks.  Keep a simple routine that is the same for waking, meals, bathing, dressing, and bedtime.  Create a calm and quiet home.  Put up clocks and calendars.  Keep emergency numbers and the home address near all phones.  Help show the different times of day. Open the curtains during the day to let light in. Speak clearly and directly.  Choose simple words and short sentences.  Use a gentle, calm voice.  Do not interrupt.  If the person has a hard time finding a word to  use, give them the word or thought.  Ask 1 question at a time. Give enough time for the person to answer. Repeat the question if the person does not answer. Do things that lessen restlessness.  Provide a comfortable bed.  Have the same bedtime routine every night.  Have a regular walking and activity schedule.  Lessen naps during the day.  Do not let the person drink a lot of caffeine.  Go to events that are not overwhelming. Eat well and drink fluids.  Lessen distractions during meal times and snacks.  Avoid foods that are too hot or too cold.  Watch how the person chews and swallows. This is to make sure they do not choke. Other  Keep all vision, hearing, dental, and medical visits with the doctor.  Only give medicines as told by the doctor.  Watch the person's driving ability. Do not let the person drive if he or she cannot drive safely.  Use a program that helps find a person if they become missing. You may need to register with this program. GET HELP RIGHT AWAY IF:   A fever of 102 F (38.9 C) develops.  Confusion develops or gets worse.  Sleepiness develops or gets worse.  Staying awake is hard to do.  New behavior problems start like mood swings, aggression, and seeing things that are not there.  Problems with balance, speech, or falling develop.  Problems swallowing develop.  Any   problems of another sickness develop. MAKE SURE YOU:  Understand these instructions.  Will watch his or her condition.  Will get help right away if he or she is not doing well or gets worse. Document Released: 03/28/2008 Document Revised: 07/08/2011 Document Reviewed: 09/10/2010 [employer] Patient Information 2014 [employer].  

## 2013-04-20 NOTE — Progress Notes (Signed)
[facility]  Provider:  Dr Vickey Huger Referring Provider: Mathis Fare, OTR Primary Care Physician:  Marvis Repress, MD  Chief Complaint  Patient presents with  . Follow-up    7 mo , Rm 10    HPI:  Suzanne Fritz is a 79 y.o. female here as a follow up , from Dr.  Christell Constant- for dementia workup.   Interval history: I have followed Suzanne Fritz now for about 4 years, the patient has developed a progressive form of memory loss over these years. Today's mini mental status examination score was  20/30 points , with  the main difficulties in the 3 word recall , the complete address of  my office, the day of the week.  The patient was able to draw a clock face but not to place the hands of the clock, she copied in image and the lines short fine tremor in. Her animal fluency test only generated  6 words. Today's fatigue severity scale was 56 points/  Epworth score 15 points , and geriatric depression score 5 points.    In the very first encounter in  2011 we reviewed a Mini-Mental Status Examination and at that time the patient scored MMSE 23 points, MMSE on 09-16-12  was 13 -30 points.  she also fell  Asleep during the test !  Frequently , was excessively daytime sleepy- the Epworth score varied between 17 and 24 points.    In her first visit at chest CT was reviewed which showed a 2 x 2 centimeter lesion nodular in her left upper pulmonary lobe. This was attributed to a severe recurrent bronchitis and probably there was COPD 2 the patient is on inhalers and has used steroids from time to time, she has a history of hypothyroidism, she also underwent a sleep study for her severe hypersomnia on 05/11/2010 there was no evidence of REM behavior disorder during that sleep study noted.  The patient had an apnea HI of 0.9,  did not reqiere any intervention. She underwent  Brain imaging  , given the severe  progression of what was thought to be a milder form of dementia.    Patient's older sister passed away from an advanced form of dementia lin 2014 , she was 43.   Review of Systems: Out of a complete 14 system review, the patient complains of only the following symptoms, and all other reviewed systems are negative. None per patient.  Sleepy.   History   Social History  . Marital Status: Married    Spouse Name: N/A    Number of Children: 3  . Years of Education: N/A   Occupational History  . Retired    Social History Main Topics  . Smoking status: Former Smoker    Quit date: 04/29/1974  . Smokeless tobacco: Not on file  . Alcohol Use: No  . Drug Use: No  . Sexually Active: Not on file   Other Topics Concern  . Not on file   Social History Narrative    She is married, three children, retired. ( 62, 60 and 55 ) two sons and one daughter.   Quit smoking over  36     years ago.  Does not drink alcohol on a regular basis.  Retired form  Educational psychologist at age 52.    Family History  Problem Relation Age of Onset  . Stroke Mother 45  . Cancer Father 52    Stomach  . Coronary artery disease Brother  2 brothers , but alive  . Migraines Child     Past Medical History  Diagnosis Date  . Hypercholesterolemia   . Depression   . Hypertension   . GERD (gastroesophageal reflux disease)   . Peripheral neuropathy   . Dementia   . Hypothyroidism   . Adenocarcinoma   . Lung cancer 04/2010    upper lobe removed   . Alzheimer disease   . Arthritis   . Back pain   . Hyperlipidemia   . Migraine   . Behavior disorder     REM  . Osteoarthritis     RIGHT KNEE  . Syncope and collapse 02/2010    HOSPITALIZED  . Anxiety     STRESS AND MENOPAUSAL SYMPTOMS  . Memory loss   . Hearing loss   . Memory problem   . Hearing difficulty     DECREASED HEARING    Past Surgical History  Procedure Laterality Date  . Left upper lobe resection  04/2010  . Appendectomy    . Thyroidectomy    . Cholecystectomy    . Vaginal hysterectomy     . Hemorrhoid surgery    . Back surgery    . Knee surgery      RIGHT SIDE    Current Outpatient Prescriptions  Medication Sig Dispense Refill  . aspirin 81 MG tablet Take 81 mg by mouth daily.        . Calcium Carbonate (CALCIUM 600 PO) Take by mouth. 1 po daily       . Cholecalciferol (VITAMIN D) 1000 UNITS capsule Take 1,000 Units by mouth daily.        . diazepam (VALIUM) 5 MG tablet Take 5 mg by mouth 2 (two) times daily. 1/2 two times daily in afternoon      . diclofenac (VOLTAREN) 75 MG EC tablet Take 75 mg by mouth daily.      Marland Kitchen donepezil (ARICEPT) 10 MG tablet Take 10 mg by mouth 2 (two) times daily.      Marland Kitchen FLUoxetine (PROZAC) 40 MG capsule Take 40 mg by mouth daily.        Marland Kitchen glucosamine-chondroitin 500-400 MG tablet Take 1 tablet by mouth daily.        Marland Kitchen HYDROcodone-acetaminophen (NORCO/VICODIN) 5-325 MG per tablet Take 1 tablet by mouth every 6 (six) hours as needed for moderate pain.      Marland Kitchen levothyroxine (SYNTHROID, LEVOTHROID) 100 MCG tablet Take 100 mcg by mouth.       . meclizine (ANTIVERT) 25 MG tablet Take 25 mg by mouth as needed.        . memantine (NAMENDA) 10 MG tablet Take 1 tablet (10 mg total) by mouth 2 (two) times daily.  180 tablet  3  . omeprazole (PRILOSEC) 20 MG capsule Take 20 mg by mouth 2 (two) times daily.        . pravastatin (PRAVACHOL) 40 MG tablet Take 40 mg by mouth at bedtime and may repeat dose one time if needed.      . promethazine (PHENERGAN) 25 MG tablet Take 25 mg by mouth every 6 (six) hours as needed.        . vitamin E 100 UNIT capsule Take 100 Units by mouth 2 (two) times daily.      Marland Kitchen ZOSTAVAX 78469 UNT/0.65ML injection        No current facility-administered medications for this visit.    Allergies as of 04/20/2013 - Review Complete 04/20/2013  Allergen Reaction Noted  .  Codeine  09/11/2010    Vitals: BP 171/72  Pulse 55  Resp 17  Ht 5\' 4"  (1.626 m)  Wt 201 lb (91.173 kg)  BMI 34.48 kg/m2 Last Weight:  Wt Readings from Last  1 Encounters:  04/20/13 201 lb (91.173 kg)   Last Height:   Ht Readings from Last 1 Encounters:  04/20/13 5\' 4"  (1.626 m)   Vision Screening:  Physical exam:  General: The patient is awake, alert and appears not in acute distress. The patient is well groomed.  She has a history of progressive memory loss , she endorsed the Epworth sleepiness score of 15  points and score today on an and MMSE 20 out of 30 points.  Head: Normocephalic, atraumatic. Neck is supple. Cardiovascular:  Regular rate and rhythm, without  murmurs or carotid bruit, and without distended neck veins. Respiratory: Lungs are clear to auscultation. Skin:  Without evidence of edema, or rash Trunk: BMI is elevated and patient  has normal posture.  Neurologic exam : The patient is awake and alert, oriented to place and time.  Memory  Severely disturbed -. There is a normal attention span & concentration ability, but she forgets quickly.  Speech is fluent without dysarthria, dysphonia or aphasia. Mood and affect are appropriate.  Cranial nerves: Pupils are equal and briskly reactive to light.Extraocular movements  in vertical and horizontal planes intact and without nystagmus. Visual fields by finger perimetry are intact. Hearing to finger rub intact.  Facial sensation intact to fine touch. Facial motor strength is symmetric and tongue and uvula move midline.  Motor exam: Normal tone and normal muscle bulk and symmetric normal strength in all extremities.  Sensory:  Fine touch, pinprick and vibration were tested in all extremities. Proprioception is tested in the upper extremities only. - normal.  Coordination: Rapid alternating movements in the fingers/hands is tested and normal. Finger-to-nose maneuver tested and normal without evidence of ataxia, dysmetria or tremor.  Gait and station: Patient walks without assistive device . Strength within normal limits. Stance is stable and normal.  Tandem gaitunfragmented. Romberg  testing is normal.  Deep tendon reflexes: in the  upper and lower extremities are symmetric and intact. Babinski maneuver response is downgoing.   Assessment:  After physical and neurologic examination, review of laboratory studies, imaging, neurophysiology testing and pre-existing records, assessment :  This patient, with otherwise an excellent physical health, presents with progressive memory loss but scored today 20 out of 30 points, better than last visit!Marland Kitchen  When we evaluated her in the past for hypersomnia, there was no evidence of sleep disordered breathing found and neither at least not for the night of testing was there any parasomnia noted.  The patient continues to complain of excessive daytime sleepiness and very irregular sleep wake rhythms and daytime naps. She sleeps up to 10-12 hours daily.   Her memory tests has progressively declined - The first MSSE test scored 23/30 points in early 2011,. a brain MRI was obtained which showed no abnormalities,  just some generalized brain atrophy.  Her  labs were indicating a renal insufficiency, and her chest CT at that time showed a concerning 2 centimeter by 2 cm lesion that had been followed up with a PET scan her pulmonologist declared cancerous .  Surgery followed and Pulmonology  is regularly following that. She had a biopsy and the lesion was considered cancerous. She is followed by Dr. Remonia Richter.    Plan:  Treatment plan and additional workup:  The patient is  not longer driving, after getting lost on a familiar way to visit her sister.  Day time assistance provided by her 2 daughters, needs help with meal prep and shower.  Supervision needed.   She is taking 23 mg of Aricept and tolerated this medication rather well. She is already taking Namenda 10 mg twice a day as well. Refills provided.  Dr. Reginold Agent prescribes Hydrocodon twice a day for pain.  She continues on the generic form of Prozac, namely fluoxetine 40 mg a day.  I do not  suspect a paraneoplastic impact on her memory loss.  She  also did not undergo radiation or chemotherapy.  I strongly suspect that this is a progressive form of dementia, likely Alzheimer's disease, since her sister was affected but none of her parents. Marland Kitchen

## 2013-05-13 ENCOUNTER — Ambulatory Visit (INDEPENDENT_AMBULATORY_CARE_PROVIDER_SITE_OTHER): Payer: Medicare Other | Admitting: Family Medicine

## 2013-05-13 ENCOUNTER — Encounter: Payer: Self-pay | Admitting: Family Medicine

## 2013-05-13 VITALS — BP 147/73 | HR 74 | Temp 97.1°F | Ht 64.0 in | Wt 195.0 lb

## 2013-05-13 DIAGNOSIS — M549 Dorsalgia, unspecified: Secondary | ICD-10-CM

## 2013-05-13 DIAGNOSIS — N390 Urinary tract infection, site not specified: Secondary | ICD-10-CM

## 2013-05-13 DIAGNOSIS — R350 Frequency of micturition: Secondary | ICD-10-CM

## 2013-05-13 LAB — POCT UA - MICROSCOPIC ONLY
Casts, Ur, LPF, POC: NEGATIVE
Crystals, Ur, HPF, POC: NEGATIVE
Mucus, UA: NEGATIVE
Yeast, UA: NEGATIVE

## 2013-05-13 LAB — POCT URINALYSIS DIPSTICK
Bilirubin, UA: NEGATIVE
Glucose, UA: NEGATIVE
Ketones, UA: NEGATIVE
Nitrite, UA: NEGATIVE
Spec Grav, UA: 1.02
Urobilinogen, UA: NEGATIVE
pH, UA: 5

## 2013-05-13 MED ORDER — NITROFURANTOIN MONOHYD MACRO 100 MG PO CAPS: 100.0000 mg | ORAL_CAPSULE | Freq: Two times a day (BID) | ORAL | Status: DC

## 2013-05-13 NOTE — Progress Notes (Signed)
   Subjective:    Patient ID: Suzanne Fritz, female    DOB: 1931/07/06, 80 y.o.   MRN: [id number]  HPI This 80 y.o. female presents for evaluation of urinary frequency and back discomfort.   Review of Systems No chest pain, SOB, HA, dizziness, vision change, N/V, diarrhea, constipation, dysuria, urinary urgency or frequency, myalgias, arthralgias or rash.     Objective:   Physical Exam  Vital signs noted  Well developed well nourished female.  HEENT - Head atraumatic Normocephalic                Eyes - PERRLA, Conjuctiva - clear Sclera- Clear EOMI                Ears - EAC's Wnl TM's Wnl Gross Hearing WNL.                Throat - oropharanx wnl Respiratory - Lungs CTA bilateral Cardiac - RRR S1 and S2 without murmur GI - Abdomen soft Nontender and bowel sounds active x 4 Extremities - No edema. Neuro - Grossly intact.  Results for orders placed in visit on 05/13/13  POCT UA - MICROSCOPIC ONLY      Result Value Range   WBC, Ur, HPF, POC 20-40     RBC, urine, microscopic 3-5     Bacteria, U Microscopic few     Mucus, UA negative     Epithelial cells, urine per micros few     Crystals, Ur, HPF, POC negative     Casts, Ur, LPF, POC negative     Yeast, UA negative    POCT URINALYSIS DIPSTICK      Result Value Range   Color, UA orange     Clarity, UA clear     Glucose, UA negative     Bilirubin, UA negative     Ketones, UA negative     Spec Grav, UA 1.020     Blood, UA trace     pH, UA 5.0     Protein, UA trace     Urobilinogen, UA negative     Nitrite, UA negative     Leukocytes, UA large (3+)        Assessment & Plan:  Urinary frequency - Plan: POCT UA - Microscopic Only, POCT urinalysis dipstick, nitrofurantoin, macrocrystal-monohydrate, (MACROBID) 100 MG capsule  Back pain - Plan: nitrofurantoin, macrocrystal-monohydrate, (MACROBID) 100 MG capsule  UTI (lower urinary tract infection) - Plan: nitrofurantoin, macrocrystal-monohydrate, (MACROBID) 100 MG capsule,  Urine culture.  Take motrin and tylenol otc for back pain  Lysbeth Penner FNP

## 2013-05-13 NOTE — Patient Instructions (Signed)
Asymptomatic Bacteriuria, Female Asymptomatic bacteriuria is a significant number of bacteria in your urine that occur without the usual symptoms of burning or frequent urination. The following conditions increase risk of asymptomatic bacteriuria:  Diabetes mellitus.  Advanced age.  Pregnancy in the first trimester.  Kidney stones.  Kidney transplants.  Leaky kidney tube valve in young children (reflux). Treatment for asymptomatic bacteriuria is not required in most people and can lead to other problems such as yeast overgrowth and development of resistant bacteria. However, some people, such as pregnant women, do need treatment to prevent kidney infection. Asymptomatic bacteriuria in pregnancy is also associated with fetal growth restriction, premature labor, and newborn death. HOME CARE INSTRUCTIONS Monitor your bacteriuria for any changes. The following actions may help to alleviate any discomfort you are experiencing:  Drink enough water and fluids to keep your urine clear or pale yellow. Go to the bathroom more frequently to keep your bladder empty.  Keep the area around your vagina and rectum clean. Wipe yourself from front to back after urinating. SEEK IMMEDIATE MEDICAL CARE IF:  You develop signs of an infection such asburning with urination, frequency of voiding, back pain, or fever.  You have blood in the urine.  You develop a fever. Document Released: 04/15/2005 Document Revised: 12/16/2012 Document Reviewed: 10/05/2012 [facility] Patient Information 2014 [employer].

## 2013-06-01 ENCOUNTER — Other Ambulatory Visit: Payer: Self-pay | Admitting: Family Medicine

## 2013-06-02 NOTE — Telephone Encounter (Signed)
Patient last seen in office on 05-13-13. Also seen in office that day. Please advise if refill needed

## 2013-06-07 ENCOUNTER — Other Ambulatory Visit: Payer: Self-pay | Admitting: Family Medicine

## 2013-06-08 NOTE — Telephone Encounter (Signed)
Last seen 05/13/13  B [place]

## 2013-10-19 ENCOUNTER — Encounter: Payer: Self-pay | Admitting: Adult Health

## 2013-10-19 ENCOUNTER — Ambulatory Visit (INDEPENDENT_AMBULATORY_CARE_PROVIDER_SITE_OTHER): Payer: Medicare Other | Admitting: Adult Health

## 2013-10-19 VITALS — BP 157/81 | HR 71 | Wt 193.0 lb

## 2013-10-19 DIAGNOSIS — F0280 Dementia in other diseases classified elsewhere without behavioral disturbance: Secondary | ICD-10-CM

## 2013-10-19 DIAGNOSIS — G309 Alzheimer's disease, unspecified: Principal | ICD-10-CM

## 2013-10-19 NOTE — Progress Notes (Signed)
I agree with the assessment and plan as directed by NP .The patient is known to me .   Violette Morneault, MD  

## 2013-10-19 NOTE — Patient Instructions (Signed)
Alzheimer Disease Alzheimer's Disease is a mental disorder. It causes memory loss and loss of other mental functions, such as learning, thinking, solving problems, communicating, and completing tasks. The mental losses interfere with the ability to perform daily activities at work, at home, or in social situations. Alzheimer's Disease usually starts in a person's late 60s or early 70s but can start earlier in life (familial form). The mental changes caused by this disease are permanent and worsen over time. As the illness progresses, the ability to do even the simplest things is lost. Survival with Alzheimer's Disease ranges from several years to as long as 20 years. CAUSES Alzheimer's Disease is caused by abnormally high levels of a protein (beta-amyloid) in the brain. This protein forms very small deposits within and around the brain's nerve cells. These deposits prevent the nerve cells from working properly. Experts are not certain what causes the beta-amyloid deposits in this disease. RISK FACTORS The following major risk factors have been identified:  Increasing age.  Certain genetic variations, such as Down syndrome (trisomy 21). SYMPTOMS In the early stages of Alzheimer's Disease, you are still able to perform daily activities but need greater effort, more time, or memory aids. Early symptoms include:  Mild memory loss of recent events, names, or phone numbers.  Loss of objects.  Minor loss of vocabulary.  Difficulty with complex tasks, such as paying bills or driving in unfamiliar locations. Other mental functions deteriorate as the disease worsens. These changes slowly go from mild to severe. Symptoms at this stage include:  Difficulty remembering. You may not be able to recall personal information such as your address and telephone number. You may become confused about the date, the season of the year, or your location.  Difficulty maintaining attention. You may forget what you  wanted to say during conversations and repeat what you have already said.  Difficulty learning new information or tasks. You may not remember what you read or the name of a new friend you met.  Difficulty counting or doing math. You may have difficulty with complex math problems. You may make mistakes in paying bills or managing your checkbook.  Poor reasoning and judgment. You may make poor decisions or not dress right for the weather.  Difficulty communicating. You may have regular difficulty remembering words, naming objects, expressing yourself clearly, or writing sentences that make sense.  Difficulty performing familiar daily activities. You may get lost driving in familiar locations or need help eating, bathing, dressing, grooming, or using the toilet. You may have difficulty maintaining bladder or bowel control.  Difficulty recognizing familiar faces. You may confuse family members or close friends with one another. You may not recognize a close relative or may mistake strangers for family. Alzheimer's Disease also may cause changes in personality and behavior. These changes include:   Loss of interest or motivation.  Social withdrawal.  Anxiety.  Difficulty sleeping.  Uncharacteristic anger or combativeness.  A false belief that someone is trying to harm you (paranoia).  Seeing things that are not real (hallucinations).  Agitation. Confusion and disruptive behavior are often worse at night and may be triggered by changes in the environment or acute medical issues. DIAGNOSIS  Alzheimer's Disease is diagnosed through an assessment by your health care Kyona Chauncey. During this assessment, your health care Cynthia Cogle will do the following:  Ask you and your family, friends, or caregiver questions about your symptoms, their frequency, their duration and progression, and the effect they are having on your life.    Ask questions about your personal and family medical history and use of  alcohol or drugs, including prescription medicine.  Perform a physical exam and order blood tests and brain imaging exams. Your health care Alliyah Roesler may refer you to a specialist for detailed evaluation of your mental functions (neuropsychological testing).  Many different brain disorders, medical conditions, and certain substances can cause symptoms that resemble Alzheimer's Disease symptoms. These must be ruled out before this disease can be diagnosed. If Alzheimer's Disease is diagnosed, it will be considered either "possible" or "probable" Alzheimer's Disease. "Possible" Alzheimer's Disease means that your symptoms are typical of the disease and no other disorder is causing them. "Probable" Alzheimer's Disease means that you also have a family history of the disease or genetic test results that support the diagnosis. Certain tests, mostly used in research studies, are highly specific for Alzheimer's Disease.  TREATMENT  There is currently no cure for this disease. The goals of treatment are to:  Slow down the progression of the disease.  Preserve mental function as long as possible.  Manage behavioral symptoms.  Make life easier for the person with Alzheimer's Disease and his or her caregivers. The following treatment options are available:  Medicine. Certain medicines may help slow memory loss by changing the level of certain chemicals in the brain. Medicine may also help with behavioral symptoms.  Talk therapy. Talk therapy provides education, support, and memory aids for people with this disease. It is most effective in the early stages of the illness.  Caregiving. Caregivers may be family members, friends, or trained medical professionals. They help the person with Alzheimer's Disease with daily life activities. Caregiving may take place at home or at a nursing facility.  Family support groups. These provide education, emotional support, and information about community resources to  family members who are taking care of the person with this disease. Document Released: 12/26/2003 Document Revised: 04/20/2013 Document Reviewed: 08/21/2012 [employer] Patient Information 2015 [employer]. This information is not intended to replace advice given to you by your health care Nikitas Davtyan. Make sure you discuss any questions you have with your health care Jessy Calixte.  

## 2013-10-19 NOTE — Progress Notes (Addendum)
PATIENT: Suzanne Fritz DOB: 1932-02-23  REASON FOR VISIT: follow up HISTORY FROM: patient  HISTORY OF PRESENT ILLNESS: [name] is an 80 year old female with a history of memory loss. She returns today for follow-up. Patient currently takes Aricept and Namenda and is tolerating it well.  Family feel that her memory has gotten worse.  She no longer operates a motor vehicle. Patient lives with her daughter. She needs assistance with cooking meals and supervision with bathing and dressing. Family denies agitation. Daughter reports that she is constantly stating that she has something in her mouth but she doesn't. Patient feels that she has "strings" inside her mouth. Patient denies this being bothersome for her. Daughter does report that she sometimes has a hard time getting the patient to go take a bath and dress. Daughter reports that the patient has a good appetite, sometimes she forgets that she has eaten and will eat 3 times. No new medical issues since the last visit.    REVIEW OF SYSTEMS: Full 14 system review of systems performed and notable only for:  Constitutional: N/A  Eyes: N/A Ear/Nose/Throat: hearing loss Skin: N/A  Cardiovascular: N/A  Respiratory: N/A  Gastrointestinal: N/A  Genitourinary: N/A Hematology/Lymphatic: N/A  Endocrine: N/A Musculoskeletal: joint pain, joint swelling, back pain  Allergy/Immunology: N/A  Neurological: memory loss Psychiatric: confusion, decreased concentration Sleep: N/A   ALLERGIES: Allergies  Allergen Reactions  . Codeine     HOME MEDICATIONS: Outpatient Prescriptions Prior to Visit  Medication Sig Dispense Refill  . aspirin 81 MG tablet Take 81 mg by mouth daily.        . Calcium Carbonate (CALCIUM 600 PO) Take by mouth. 1 po daily       . Cholecalciferol (VITAMIN D) 1000 UNITS capsule Take 1,000 Units by mouth daily.        . diazepam (VALIUM) 5 MG tablet Take 5 mg by mouth 2 (two) times daily. 1/2 two times daily in  afternoon      . donepezil (ARICEPT) 10 MG tablet Take 1 tablet (10 mg total) by mouth 2 (two) times daily.  180 tablet  3  . FLUoxetine (PROZAC) 40 MG capsule Take 40 mg by mouth daily.        Marland Kitchen glucosamine-chondroitin 500-400 MG tablet Take 1 tablet by mouth daily.        Marland Kitchen HYDROcodone-acetaminophen (NORCO/VICODIN) 5-325 MG per tablet Take 1 tablet by mouth every 6 (six) hours as needed for moderate pain.      Marland Kitchen levothyroxine (SYNTHROID, LEVOTHROID) 100 MCG tablet Take 100 mcg by mouth.       . meclizine (ANTIVERT) 25 MG tablet Take 25 mg by mouth as needed.        . memantine (NAMENDA) 10 MG tablet Take 1 tablet (10 mg total) by mouth 2 (two) times daily.  180 tablet  3  . omeprazole (PRILOSEC) 20 MG capsule Take 20 mg by mouth 2 (two) times daily.        . promethazine (PHENERGAN) 25 MG tablet Take 25 mg by mouth every 6 (six) hours as needed.        . vitamin E 100 UNIT capsule Take 100 Units by mouth 2 (two) times daily.      . nitrofurantoin, macrocrystal-monohydrate, (MACROBID) 100 MG capsule TAKE ONE CAPSULE BY MOUTH TWICE DAILY  20 capsule  0  . pravastatin (PRAVACHOL) 40 MG tablet Take 40 mg by mouth at bedtime and may repeat dose one time  if needed.       No facility-administered medications prior to visit.    PAST MEDICAL HISTORY: Past Medical History  Diagnosis Date  . Hypercholesterolemia   . Depression   . Hypertension   . GERD (gastroesophageal reflux disease)   . Peripheral neuropathy   . Dementia   . Hypothyroidism   . Adenocarcinoma   . Lung cancer 04/2010    upper lobe removed   . Alzheimer disease   . Arthritis   . Back pain   . Hyperlipidemia   . Migraine   . Behavior disorder     REM  . Osteoarthritis     RIGHT KNEE  . Syncope and collapse 02/2010    HOSPITALIZED  . Anxiety     STRESS AND MENOPAUSAL SYMPTOMS  . Memory loss   . Hearing loss   . Memory problem   . Hearing difficulty     DECREASED HEARING    PAST SURGICAL HISTORY: Past Surgical  History  Procedure Laterality Date  . Left upper lobe resection  04/2010  . Appendectomy    . Thyroidectomy    . Cholecystectomy    . Vaginal hysterectomy    . Hemorrhoid surgery    . Back surgery    . Knee surgery      RIGHT SIDE    FAMILY HISTORY: Family History  Problem Relation Age of Onset  . Stroke Mother 29  . Cancer Father 60    Stomach  . Coronary artery disease Brother     2 brothers , but alive  . Migraines Child     SOCIAL HISTORY: History   Social History  . Marital Status: Married    Spouse Name: frank    Number of Children: 3  . Years of Education: 11   Occupational History  . Retired   .     Social History Main Topics  . Smoking status: Former Smoker    Quit date: 04/29/1974  . Smokeless tobacco: Never Used  . Alcohol Use: No  . Drug Use: No  . Sexual Activity: Not on file   Other Topics Concern  . Not on file   Social History Narrative    She is married, three chill, retired.  Quit smoking 36     years ago.  Does not drink alcohol on a regular basis.       PHYSICAL EXAM  Filed Vitals:   10/19/13 0940  BP: 157/81  Pulse: 71  Weight: 193 lb (87.544 kg)   Body mass index is 33.11 kg/(m^2).  Generalized: Well developed, in no acute distress   Neurological examination  Mentation: Alert oriented to time, place, history taking. Follows all commands speech and language fluent. MMSE 14/30 Cranial nerve II-XII:  Extraocular movements were full, visual field were full on confrontational test.  Motor: The motor testing reveals 5 over 5 strength of all 4 extremities. Good symmetric motor tone is noted throughout.  Sensory: Sensory testing is intact to soft touch on all 4 extremities. No evidence of extinction is noted.  Coordination: Cerebellar testing reveals good finger-nose-finger and heel-to-shin bilaterally.  Gait and station: Gait is normal.  Reflexes: Deep tendon reflexes are symmetric and normal bilaterally.    DIAGNOSTIC DATA  (LABS, IMAGING, TESTING) - I reviewed patient records, labs, notes, testing and imaging myself where available.  Lab Results  Component Value Date   WBC 9.6 10/03/2010   HGB 9.9* 10/03/2010   HCT 29.7* 10/03/2010   MCV 89.5 10/03/2010  PLT 197 10/03/2010      Component Value Date/Time   NA 133* 10/03/2010 0425   K 3.8 10/03/2010 0425   CL 100 10/03/2010 0425   CO2 27 10/03/2010 0425   GLUCOSE 131* 10/03/2010 0425   BUN 12 10/03/2010 0425   CREATININE 0.98 10/03/2010 0425   CALCIUM 8.8 10/03/2010 0425   PROT 7.9 09/25/2010 1415   ALBUMIN 3.5 09/25/2010 1415   AST 20 09/25/2010 1415   ALT 16 09/25/2010 1415   ALKPHOS 112 09/25/2010 1415   BILITOT 0.2* 09/25/2010 1415   GFRNONAA 55* 10/03/2010 0425   GFRAA  Value: >60        The eGFR has been calculated using the MDRD equation. This calculation has not been validated in all clinical situations. eGFR's persistently <60 mL/min signify possible Chronic Kidney Disease. 10/03/2010 0425       ASSESSMENT AND PLAN 80 y.o. year old female  has a past medical history of Hypercholesterolemia; Depression; Hypertension; GERD (gastroesophageal reflux disease); Peripheral neuropathy; Dementia; Hypothyroidism; Adenocarcinoma; Lung cancer (04/2010); Alzheimer disease; Arthritis; Back pain; Hyperlipidemia; Migraine; Behavior disorder; Osteoarthritis; Syncope and collapse (02/2010); Anxiety; Memory loss; Hearing loss; Memory problem; and Hearing difficulty. here with: 1. Dementia in Alzheimer's disease  Patient's memory has continued to decline. Patient continues to take Aricept and Namenda and is tolerating it well. The family does report some possible hallucinations, the patient feels that she has "strings" in her mouth. Patient reports that this is not bothersome for her but it does appear to be bothersome for the family. I advised the family to make sure the patient stays well hydrated. At this time we will not start  antipsychotic medication due to potential side effects. Family  advised that if the patient's behavior becomes more agitated or fearful of hallucinations they are to let us know. I also spoke with the daughter regarding getting outside assistance in the home for a couple of hours each day to allow family to have a break from taking care of the patient. Patient should followup in 6 months or sooner if needed.  Ward Givens, MSN, NP-C 10/19/2013, 9:47 AM [facility] [address], [place], [place] [address] [phone]  Note: This document was prepared with digital dictation and possible smart phrase technology. Any transcriptional errors that result from this process are unintentional.

## 2013-11-18 ENCOUNTER — Other Ambulatory Visit: Payer: Self-pay

## 2013-11-18 MED ORDER — DONEPEZIL HCL 10 MG PO TABS: 10.0000 mg | ORAL_TABLET | Freq: Two times a day (BID) | ORAL | Status: DC

## 2014-02-14 DIAGNOSIS — D509 Iron deficiency anemia, unspecified: Secondary | ICD-10-CM | POA: Insufficient documentation

## 2014-03-16 ENCOUNTER — Encounter: Payer: Self-pay | Admitting: Neurology

## 2014-03-22 ENCOUNTER — Encounter: Payer: Self-pay | Admitting: Neurology

## 2014-03-29 DIAGNOSIS — J189 Pneumonia, unspecified organism: Secondary | ICD-10-CM

## 2014-03-29 HISTORY — DX: Pneumonia, unspecified organism: J18.9

## 2014-04-20 ENCOUNTER — Ambulatory Visit: Payer: Medicare Other | Admitting: Adult Health

## 2014-05-18 ENCOUNTER — Other Ambulatory Visit: Payer: Self-pay

## 2014-05-18 MED ORDER — DONEPEZIL HCL 10 MG PO TABS: 10.0000 mg | ORAL_TABLET | Freq: Two times a day (BID) | ORAL | Status: DC

## 2014-05-30 ENCOUNTER — Encounter: Payer: Self-pay | Admitting: Neurology

## 2014-05-30 ENCOUNTER — Ambulatory Visit (INDEPENDENT_AMBULATORY_CARE_PROVIDER_SITE_OTHER): Payer: Medicare Other | Admitting: Neurology

## 2014-05-30 VITALS — BP 139/73 | HR 64 | Resp 18 | Ht 63.25 in | Wt 192.0 lb

## 2014-05-30 DIAGNOSIS — R159 Full incontinence of feces: Secondary | ICD-10-CM

## 2014-05-30 DIAGNOSIS — F0390 Unspecified dementia without behavioral disturbance: Secondary | ICD-10-CM

## 2014-05-30 DIAGNOSIS — F32A Depression, unspecified: Secondary | ICD-10-CM

## 2014-05-30 DIAGNOSIS — G309 Alzheimer's disease, unspecified: Secondary | ICD-10-CM

## 2014-05-30 DIAGNOSIS — N3944 Nocturnal enuresis: Secondary | ICD-10-CM

## 2014-05-30 DIAGNOSIS — F329 Major depressive disorder, single episode, unspecified: Secondary | ICD-10-CM | POA: Insufficient documentation

## 2014-05-30 DIAGNOSIS — F03C0 Unspecified dementia, severe, without behavioral disturbance, psychotic disturbance, mood disturbance, and anxiety: Secondary | ICD-10-CM

## 2014-05-30 DIAGNOSIS — R42 Dizziness and giddiness: Secondary | ICD-10-CM | POA: Insufficient documentation

## 2014-05-30 DIAGNOSIS — F0280 Dementia in other diseases classified elsewhere without behavioral disturbance: Secondary | ICD-10-CM

## 2014-05-30 HISTORY — DX: Depression, unspecified: F32.A

## 2014-05-30 HISTORY — DX: Unspecified dementia, severe, without behavioral disturbance, psychotic disturbance, mood disturbance, and anxiety: F03.C0

## 2014-05-30 HISTORY — DX: Unspecified dementia without behavioral disturbance: F03.90

## 2014-05-30 NOTE — Patient Instructions (Signed)
Please ask Dr. Lenor Coffin to share the December labs with [place] at [facility]. We need to look at her metabolic function.

## 2014-05-30 NOTE — Progress Notes (Signed)
[facility]  Provider:  Dr Keller Mikels Referring Provider: Chipper Herb, MD Primary Care Physician:  Redge Gainer, MD  Chief Complaint  Patient presents with  . RV Dementia    Rm 62, Daughters ( Suzanne Fritz and Suzanne Fritz)    HPI:  Suzanne Fritz is a 81 y.o. female here as a follow up , from Dr. Laurance Flatten- for  Alzheimer's dementia follow up  Interval history: I have followed Suzanne Fritz now for about 4 years, and  the patient has developed a progressive form of memory loss over these years. Her mini mental status examination score was  20/30 points in 2014  with  the main difficulties in the 3 word recall , the complete address of  my office, the day of the week.  The patient was able to draw a clock face but not to place the hands of the clock, she copied in image and the lines short fine tremor in. Her animal fluency test only generated  6 words. In the very first encounter in  2011 we reviewed a Mini-Mental Status Examination and at that time the patient scored MMSE 23 points, MMSE on 09-16-12  was 13 -30 points.  she also fell asleep during the test !  Frequently , was excessively daytime sleepy- the Epworth score varied between 17 and 24 points.  In her first visit at chest CT was reviewed which showed a 2 x 2 centimeter lesion nodular in her left upper pulmonary lobe. This was attributed to a severe recurrent bronchitis and probably there was COPD 2 the patient is on inhalers and has used steroids from time to time, she has a history of hypothyroidism, she also underwent a sleep study for her severe hypersomnia on 05/11/2010 there was no evidence of REM behavior disorder during that sleep study noted.The patient had an AHI of 0.9,  did not reqiere any intervention. She underwent Brain imaging  , given the severe  progression of what was originally  thought to be a milder form of dementia.  Patient's older sister passed away from an advanced form of dementia in 2014 , she was 61 at the  time of death.  2014/06/26 The patient is seen here today accompanied by HER-2 daughters, both have reported progression in day to day activity assistance but as needed. More frequent accidents especially at night with urinary control. Less fluent speech with a restricted dictionary. She also has physically been remarkably healthy and resilient there were there was one fall which did not lead to any injuries. She is mobile and she has left for years with one daughter and now lives 5 years with a second. She is under constant supervision basically in her private living quarters. Today's MMSE Mini-Mental test was 6 out of 30 points the patient has 4 years not been able to recall the words that are part of the Mini-Mental test and she today also couldn't spell backwards which she was for while able to do in the beginning. She knew the season and actually knew the day of the week today. She is aware that she lives in the state of [place]. She is not aware where my office is located which is understandable. She could generate 5 animal names. She could not copy an image and she could not draw a full clock face. All this indicates a more progressed severe form of Alzheimer's type dementia for which the patient has a family history. The patient is  wandering off at times, needs  24 hour supervison. She has not mad attempts recently . She is now more and more active at night, and sleeps more hours during the day.  She cannot longer dress herself, her husband and daughters lays out her clothes. We will need a hospital bed to help with care and diaper changes at night.        Review of Systems: Out of a complete 14 system review, the patient complains of only the following symptoms, and all other reviewed systems are negative. None per patient.  Severe stage of AD, hypersomnia , needs toiletting assistance.     History   Social History  . Marital Status: Married    Spouse Name:  N/A    Number of Children: 3  . Years of Education: N/A   Occupational History  . Retired     Social History Main Topics  . Smoking status: Former Smoker    Quit date: 04/29/1974  . Smokeless tobacco: Not on file  . Alcohol Use: No  . Drug Use: No  . Sexually Active: Not on file   Other Topics Concern  . Not on file   Social History Narrative    She is married, three children, retired. ( 40, 72 and 65 ) two sons and one daughter.   Quit smoking over  36     years ago.  Does not drink alcohol on a regular basis.  Retired form  Field seismologist at age 46.    Family History  Problem Relation Age of Onset  . Stroke Mother 59  . Cancer Father 77    Stomach  . Coronary artery disease Brother     2 brothers , but alive  . Migraines Child     Past Medical History  Diagnosis Date  . Hypercholesterolemia   . Depression   . Hypertension   . GERD (gastroesophageal reflux disease)   . Peripheral neuropathy   . Dementia   . Hypothyroidism   . Adenocarcinoma   . Lung cancer 04/2010    upper lobe removed   . Alzheimer disease   . Arthritis   . Back pain   . Hyperlipidemia   . Migraine   . Behavior disorder     REM  . Osteoarthritis     RIGHT KNEE  . Syncope and collapse 02/2010    HOSPITALIZED  . Anxiety     STRESS AND MENOPAUSAL SYMPTOMS  . Memory loss   . Hearing loss   . Memory problem   . Hearing difficulty     DECREASED HEARING  . Pneumonia 03/2014  . Advanced dementia 05/30/2014    Past Surgical History  Procedure Laterality Date  . Left upper lobe resection  04/2010  . Appendectomy    . Thyroidectomy    . Cholecystectomy    . Vaginal hysterectomy    . Hemorrhoid surgery    . Back surgery    . Knee surgery      RIGHT SIDE    Current Outpatient Prescriptions  Medication Sig Dispense Refill  . aspirin 81 MG tablet Take 81 mg by mouth daily.      . Calcium Carbonate (CALCIUM 600 PO) Take by mouth. 1 po daily     . Cholecalciferol (VITAMIN D)  1000 UNITS capsule Take 1,000 Units by mouth daily.      . diazepam (VALIUM) 5 MG tablet Take 5 mg by mouth 2 (two) times daily. 1/2 two times daily in  afternoon    . diclofenac (VOLTAREN) 75 MG EC tablet Take 75 mg by mouth daily.    Marland Kitchen donepezil (ARICEPT) 10 MG tablet Take 1 tablet (10 mg total) by mouth 2 (two) times daily. 180 tablet 0  . FLUoxetine (PROZAC) 40 MG capsule Take 40 mg by mouth daily.      Marland Kitchen glucosamine-chondroitin 500-400 MG tablet Take 1 tablet by mouth daily.      Marland Kitchen HYDROcodone-acetaminophen (NORCO/VICODIN) 5-325 MG per tablet Take 1 tablet by mouth every 6 (six) hours as needed for moderate pain.    Marland Kitchen levothyroxine (SYNTHROID, LEVOTHROID) 100 MCG tablet Take 100 mcg by mouth.     . meclizine (ANTIVERT) 25 MG tablet Take 25 mg by mouth as needed.      . memantine (NAMENDA) 10 MG tablet Take 1 tablet (10 mg total) by mouth 2 (two) times daily. 180 tablet 3  . omeprazole (PRILOSEC) 20 MG capsule Take 20 mg by mouth 2 (two) times daily.      . promethazine (PHENERGAN) 25 MG tablet Take 25 mg by mouth every 6 (six) hours as needed.      . vitamin E 100 UNIT capsule Take 100 Units by mouth 2 (two) times daily.     No current facility-administered medications for this visit.    Allergies as of 05/30/2014 - Review Complete 05/30/2014  Allergen Reaction Noted  . Codeine  09/11/2010    Vitals: BP 139/73 mmHg  Pulse 64  Resp 18  Ht 5' 3.25" (1.607 m)  Wt 192 lb (87.091 kg)  BMI 33.72 kg/m2 Last Weight:  Wt Readings from Last 1 Encounters:  05/30/14 192 lb (87.091 kg)   Last Height:   Ht Readings from Last 1 Encounters:  05/30/14 5' 3.25" (1.607 m)   Vision Screening:  Physical exam:  General: The patient is awake, alert and appears not in acute distress. The patient is well groomed.  She has a history of progressive memory loss , she endorsed the Epworth sleepiness score of 18 points and score today on an and MMSE 16 out of 30 points.  Head: Normocephalic,  atraumatic. Neck is supple. Cardiovascular:  Regular rate and rhythm, without  murmurs or carotid bruit, and without distended neck veins. Respiratory: Lungs are clear to auscultation. Skin:  Without evidence of edema, or rash Trunk: BMI is elevated ,  normal posture.  Neurologic exam : The patient is awake and alert, oriented to place and time.  Memory  Severely disturbed -. Advanced memory loss.  There is a normal attention span & concentration ability, but she forgets quickly. Speech is non- fluent with dysarthria, dysphonia ,  aphasia. Mood and affect are defensive  Cranial nerves: Pupils are equal and briskly reactive to light.Extraocular movements  in vertical and horizontal planes intact and without nystagmus. Visual fields by finger perimetry are intact. Hearing to finger rub intact.  Facial sensation intact to fine touch.  Facial motor strength is symmetric and tongue and uvula move midline.  Motor exam: Normal tone and normal muscle bulk and symmetric normal strength in all extremities.  Sensory:  Fine touch, pinprick and vibration were tested in all extremities.  Proprioception is tested in the upper extremities only. - normal.  Coordination: communication related difficulties to test- Finger-to-nose maneuver tested with evidence of ataxia, dysmetria   Gait and station: Patient walks without assistive device . Strength within normal limits. Stance is stable and normal.  Tandem gaitunfragmented. Romberg testing is normal.  Deep tendon reflexes:  in the  upper and lower extremities are symmetric and intact. Babinski maneuver response is downgoing.   Assessment:  After physical and neurologic examination, review of laboratory studies, imaging, neurophysiology testing and pre-existing records, assessment :  This patient, with otherwise an excellent physical health, presents with progressive memory loss but scored today 20 out of 30 points, better than last visit!Marland Kitchen  When we  evaluated her in the past for hypersomnia, there was no evidence of sleep disordered breathing found and neither at least not for the night of testing was there any parasomnia noted.  The patient continues to complain of excessive daytime sleepiness and very irregular sleep wake rhythms and daytime naps. She sleeps up to 10-12 hours daily.   Her memory tests has progressively declined - The first MSSE test scored 23/30 points in early 2011,. a brain MRI was obtained which showed no abnormalities,  just some generalized brain atrophy.  She is now in an advanced stage of Alzheimer's Demntia, needs more assistance  due to her cognitive decline , not due to her physical needs.  She would forget to eat regularly therefore she has to be reminded and meals have to be prepared for her of course, she also needs some toileting and dressing assistance and especially at night has developed enuresis. It will be beneficial for her caretakers if the patient would have a hospital bed that would allow them to change the patient without bending download. We also discussed a handlebar next to the past to poor into the shower tub and some other possibilities to prevent falls in the future and would like a home safety physical therapy evaluation and so or a nursing evaluation for the same reason. I'm not aware how much Medicare will contribute I didn't I do know however that this patient would be better cared for in the private home but in any institutional environment.  A hospital bed would allow her daughters and her husband to continue caring for her without physical injury to them/ himself.   Her  labs were indicating a renal insufficiency, and her chest CT at that time showed a concerning 2 centimeter by 2 cm lesion that had been followed up with a PET scan - her pulmonologist declared the node cancerous , Surgery followed and Pulmonology  is regularly following that.  She had a biopsy and the lesion was  cancerous. She  is followed by Dr. Dione Booze.    Plan:  Treatment plan and additional workup:  The patient is not longer driving, after getting lost on a familiar way to visit her sister.   Day time assistance provided by her 2 daughters, needs help with meal prep and shower.   Supervision needed.   She is taking 23 mg of Aricept and tolerated this medication rather well. She is already taking Namenda 10 mg twice a day as well. Refills provided.  Dr. Stark Jock prescribes Hydrocodon twice a day for pain. She continues on the generic form of Prozac, namely fluoxetine 40 mg a day.  I do not suspect a paraneoplastic impact on her memory loss. She also did not undergo radiation or chemotherapy. I strongly suspect that this is the later stages of dementia dementia Alzheimer's disease.

## 2014-05-30 NOTE — Addendum Note (Signed)
Addended by: Larey Seat on: 05/30/2014 11:52 AM   Modules accepted: Orders, Level of Service

## 2014-05-31 LAB — CBC WITH DIFFERENTIAL/PLATELET
BASOS ABS: 0.1 10*3/uL (ref 0.0–0.2)
BASOS: 1 %
Eos: 3 %
Eosinophils Absolute: 0.2 10*3/uL (ref 0.0–0.4)
HEMATOCRIT: 39.8 % (ref 34.0–46.6)
Hemoglobin: 13.2 g/dL (ref 11.1–15.9)
IMMATURE GRANULOCYTES: 0 %
Immature Grans (Abs): 0 10*3/uL (ref 0.0–0.1)
Lymphocytes Absolute: 2.1 10*3/uL (ref 0.7–3.1)
Lymphs: 29 %
MCH: 29.3 pg (ref 26.6–33.0)
MCHC: 33.2 g/dL (ref 31.5–35.7)
MCV: 88 fL (ref 79–97)
MONOS ABS: 0.9 10*3/uL (ref 0.1–0.9)
Monocytes: 13 %
NEUTROS ABS: 4 10*3/uL (ref 1.4–7.0)
NEUTROS PCT: 54 %
PLATELETS: 257 10*3/uL (ref 150–379)
RBC: 4.5 x10E6/uL (ref 3.77–5.28)
RDW: 13.8 % (ref 12.3–15.4)
WBC: 7.3 10*3/uL (ref 3.4–10.8)

## 2014-06-06 ENCOUNTER — Other Ambulatory Visit: Payer: Medicare Other

## 2014-06-06 ENCOUNTER — Ambulatory Visit (INDEPENDENT_AMBULATORY_CARE_PROVIDER_SITE_OTHER): Payer: Medicare Other | Admitting: Neurology

## 2014-06-06 DIAGNOSIS — G309 Alzheimer's disease, unspecified: Principal | ICD-10-CM

## 2014-06-06 DIAGNOSIS — F0390 Unspecified dementia without behavioral disturbance: Secondary | ICD-10-CM

## 2014-06-06 DIAGNOSIS — F0281 Dementia in other diseases classified elsewhere with behavioral disturbance: Secondary | ICD-10-CM

## 2014-06-08 NOTE — Progress Notes (Signed)
Quick Note:  I called and spoke to [facility], daughter of pt. I relayed the results of CBC, normal. She verbalized understanding and asked about EEG results. I relayed once read will be called with results. ______

## 2014-06-10 NOTE — Procedures (Signed)
[facility]  EEG (ELECTROENCEPHALOGRAM) REPORT   STUDY DATE:  06-06-14  PATIENT NAME: Suzanne Fritz, date of birth [birth year], H 54, gender female. GNA #[id number]  MRN: [id number]   ORDERING CLINICIAN: Larey Seat, MD   TECHNOLOGIST: Ronnald Ramp  TECHNIQUE: Electroencephalogram was recorded utilizing standard 10-20 system of lead placement and reformatted into average and bipolar montages.      RECORDING TIME:  In excess of 30 minutes, sleep deprived EEG ACTIVATION:  strobe lights    CLINICAL INFORMATION: [name] is a patient with progressive dementia of Alzheimer's dementia type. He has become more difficult to guide very repetitive and at times aggressive. She is also excessively daytime sleepy and has some sundowning at night. She has occasional hallucinations of visual and auditory origin.     FINDINGS: EEG  Background rhythm of 7 Hz   hertz . The amplitude is low and there is excessive beta fast activity noted over the frontal and frontal polar region. The heart rate remains at 54 bpm on the slow side but appears in normal sinus rhythm Photic stimulation caused  photic entrainment up to 13 Hz but no epileptiform discharges, periodic changes. Patient recorded in the awake/ drowsy and asleep  State.   IMPRESSION:  EEG is abnormally slow at 7 Hz, and global slowing is expected in any static encephalopathy such as dementia. The EEG amplitude was low, the excessive beta fast activity can be explained by medication. Epileptiform activity was not noted.  Conclusion expected EEG for a patient with advanced dementia and on medications that are sedating.   Carbon copy to Dr. Redge Gainer,  primary care physician      Larey Seat , MD

## 2014-06-23 ENCOUNTER — Telehealth: Payer: Self-pay | Admitting: *Deleted

## 2014-06-23 NOTE — Telephone Encounter (Signed)
Please send [facility] a [facility]. The old order only says electric and it needs more details. Please fax to  [phone]

## 2014-06-24 ENCOUNTER — Telehealth: Payer: Self-pay | Admitting: Neurology

## 2014-06-24 NOTE — Telephone Encounter (Signed)
Spoke to daughter to relay abnormal EEG results, but daughter had many questions that I was not able to answer.  She would like a call back from the doctor.   Thanks

## 2014-06-24 NOTE — Telephone Encounter (Signed)
Donna_ I just spoke to the daughter and explained the abnormalities. I would like to add that in my last visit face to face time with the patient on 05-30-14 I recommended a medical or hospital bed for her and a home safety evaluation. Her daughter mentioned that none of this has taken place yet. Continue please look up why? She also asked if there is any help to get for affording the necessary diapers (depends) that her mother is now wearing.  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

## 2014-06-24 NOTE — Telephone Encounter (Signed)
-----   Message from Rance Muir sent at 06/21/2014  9:43 AM EST ----- Butch Penny can you help me with this Lovey Newcomer out the office thanks dana   ----- Message -----    From: Larey Seat, MD    Sent: 06/15/2014   4:50 PM      To: Liane Comber, RN  Results to patient, please.

## 2014-06-24 NOTE — Telephone Encounter (Signed)
Butch Penny doctor Dohmeier wrote out an RX I placed on your desk. I have called Gwinda Passe she is going to try and see what she can do for patient to help her.

## 2014-06-24 NOTE — Progress Notes (Signed)
Quick Note:  Spoke to daughter to relay EEG results, but patient's daughter would like to speak to physician. ______

## 2014-06-27 ENCOUNTER — Other Ambulatory Visit: Payer: Self-pay | Admitting: *Deleted

## 2014-06-27 DIAGNOSIS — G309 Alzheimer's disease, unspecified: Principal | ICD-10-CM

## 2014-06-27 DIAGNOSIS — F0280 Dementia in other diseases classified elsewhere without behavioral disturbance: Secondary | ICD-10-CM

## 2014-06-27 NOTE — Telephone Encounter (Signed)
I placed order for [facility] Needs and did staff message Santa Genera, [facility] relating to order.

## 2014-06-27 NOTE — Progress Notes (Signed)
Order for [facility] done.   Betsy Sweetser with [facility], notified.

## 2014-06-29 NOTE — Telephone Encounter (Signed)
Send to RN to clarify details.

## 2014-06-29 NOTE — Telephone Encounter (Signed)
-  Done and faxed to Santa Genera at [facility]. Marland [phone]

## 2014-07-05 ENCOUNTER — Encounter (HOSPITAL_COMMUNITY)
Admission: RE | Admit: 2014-07-05 | Discharge: 2014-07-05 | Disposition: A | Discharge status: Home / Self Care | Payer: Medicare Other | Source: Ambulatory Visit | Attending: Family Medicine | Admitting: Family Medicine

## 2014-07-07 ENCOUNTER — Other Ambulatory Visit (HOSPITAL_COMMUNITY): Payer: Self-pay | Admitting: Specialist

## 2014-07-07 DIAGNOSIS — M48061 Spinal stenosis, lumbar region without neurogenic claudication: Secondary | ICD-10-CM

## 2014-07-13 ENCOUNTER — Ambulatory Visit (HOSPITAL_COMMUNITY)
Admission: RE | Admit: 2014-07-13 | Discharge: 2014-07-13 | Disposition: A | Discharge status: Home / Self Care | Payer: Medicare Other | Source: Ambulatory Visit | Attending: Specialist | Admitting: Specialist

## 2014-07-13 DIAGNOSIS — M48061 Spinal stenosis, lumbar region without neurogenic claudication: Secondary | ICD-10-CM

## 2014-07-13 DIAGNOSIS — M160 Bilateral primary osteoarthritis of hip: Secondary | ICD-10-CM | POA: Diagnosis not present

## 2014-07-13 DIAGNOSIS — M5136 Other intervertebral disc degeneration, lumbar region: Secondary | ICD-10-CM | POA: Insufficient documentation

## 2014-07-13 DIAGNOSIS — M545 Low back pain: Secondary | ICD-10-CM | POA: Insufficient documentation

## 2014-07-13 DIAGNOSIS — G309 Alzheimer's disease, unspecified: Secondary | ICD-10-CM | POA: Diagnosis not present

## 2014-08-13 ENCOUNTER — Other Ambulatory Visit: Payer: Self-pay | Admitting: Neurology

## 2014-08-19 ENCOUNTER — Encounter: Payer: Self-pay | Admitting: Neurology

## 2014-08-23 ENCOUNTER — Telehealth: Payer: Self-pay

## 2014-08-23 ENCOUNTER — Ambulatory Visit: Payer: Medicare Other | Admitting: Neurology

## 2014-08-23 NOTE — Telephone Encounter (Signed)
Dr. Brett Fairy out of office on 4/26, pt and daughter Mardene Celeste agreeable to r/s for 09/12/14 at 1:30 pm.

## 2014-09-12 ENCOUNTER — Ambulatory Visit (INDEPENDENT_AMBULATORY_CARE_PROVIDER_SITE_OTHER): Payer: Medicare Other | Admitting: Neurology

## 2014-09-12 ENCOUNTER — Encounter: Payer: Self-pay | Admitting: Neurology

## 2014-09-12 VITALS — BP 132/68 | HR 64 | Ht 65.75 in | Wt 189.0 lb

## 2014-09-12 DIAGNOSIS — G309 Alzheimer's disease, unspecified: Secondary | ICD-10-CM | POA: Diagnosis not present

## 2014-09-12 DIAGNOSIS — F0280 Dementia in other diseases classified elsewhere without behavioral disturbance: Secondary | ICD-10-CM

## 2014-09-12 MED ORDER — MEMANTINE HCL-DONEPEZIL HCL ER 28-10 MG PO CP24: 28.0000 mg | ORAL_CAPSULE | Freq: Every morning | ORAL | Status: DC

## 2014-09-12 NOTE — Progress Notes (Signed)
[facility]  Provider:  Dr Austyn Perriello Referring Provider: Ardelle Anton, MD Primary Care Physician:  Ardelle Anton, MD  Chief Complaint  Patient presents with  . Follow-up    memory, rm 87, with daughter    HPI:  Suzanne Fritz is a 81 y.o. female here as a follow up , from Dr. Laurance Flatten- for  Alzheimer's dementia follow up  Interval history: I have followed Mrs. Delmar now for about 4 years, and  the patient has developed a progressive form of memory loss over these years. Her mini mental status examination score was  20/30 points in 2014  with  the main difficulties in the 3 word recall , the complete address of  my office, the day of the week.  The patient was able to draw a clock face but not to place the hands of the clock, she copied in image and the lines short fine tremor in. Her animal fluency test only generated  6 words. In the very first encounter in  2011 we reviewed a Mini-Mental Status Examination and at that time the patient scored MMSE 23 points, MMSE on 09-16-12  was 13 -30 points.  she also fell asleep during the test !  Frequently , was excessively daytime sleepy- the Epworth score varied between 17 and 24 points.  In her first visit at chest CT was reviewed which showed a 2 x 2 centimeter lesion nodular in her left upper pulmonary lobe. This was attributed to a severe recurrent bronchitis and probably there was COPD 2 the patient is on inhalers and has used steroids from time to time, she has a history of hypothyroidism, she also underwent a sleep study for her severe hypersomnia on 05/11/2010 there was no evidence of REM behavior disorder during that sleep study noted.The patient had an AHI of 0.9,  did not reqiere any intervention. She underwent Brain imaging  , given the severe  progression of what was originally  thought to be a milder form of dementia.  Patient's older sister passed away from an advanced form of dementia in 2014 , she was 11 at the  time of death.  June 05, 2014 The patient is seen here today accompanied by HER-2 daughters, both have reported progression in day to day activity assistance but as needed. More frequent accidents especially at night with urinary control. Less fluent speech with a restricted dictionary. She also has physically been remarkably healthy and resilient there were there was one fall which did not lead to any injuries. She is mobile and she has left for years with one daughter and now lives 5 years with a second. She is under constant supervision basically in her private living quarters. Today's MMSE Mini-Mental test was 6 out of 30 points the patient has 4 years not been able to recall the words that are part of the Mini-Mental test and she today also couldn't spell backwards which she was for while able to do in the beginning. She knew the season and actually knew the day of the week today. She is aware that she lives in the state of [place]. She is not aware where my office is located which is understandable. She could generate 5 animal names. She could not copy an image and she could not draw a full clock face. All this indicates a more progressed severe form of Alzheimer's type dementia for which the patient has a family history. The patient is wandering off at times, needs  24 hour supervison. She has not mad attempts recently . She is now more and more active at night, and sleeps more hours during the day.  She cannot longer dress herself, her husband and daughters lays out her clothes. We will need a hospital bed to help with care and diaper changes at night.        Review of Systems: Out of a complete 14 system review, the patient complains of only the following symptoms, and all other reviewed systems are negative. None per patient.  Severe stage of AD, hypersomnia , needs toiletting assistance.     History   Social History  . Marital Status: Married    Spouse Name:  N/A    Number of Children: 3  . Years of Education: N/A   Occupational History  . Retired     Social History Main Topics  . Smoking status: Former Smoker    Quit date: 04/29/1974  . Smokeless tobacco: Not on file  . Alcohol Use: No  . Drug Use: No  . Sexually Active: Not on file   Other Topics Concern  . Not on file   Social History Narrative    She is married, three children, retired. ( 23, 24 and 63 ) two sons and one daughter.   Quit smoking over  36     years ago.  Does not drink alcohol on a regular basis.  Retired form  Field seismologist at age 74.    Family History  Problem Relation Age of Onset  . Stroke Mother 56  . Cancer Father 59    Stomach  . Coronary artery disease Brother     2 brothers , but alive  . Migraines Child     Past Medical History  Diagnosis Date  . Hypercholesterolemia   . Depression   . Hypertension   . GERD (gastroesophageal reflux disease)   . Peripheral neuropathy   . Dementia   . Hypothyroidism   . Adenocarcinoma   . Lung cancer 04/2010    upper lobe removed   . Alzheimer disease   . Arthritis   . Back pain   . Hyperlipidemia   . Migraine   . Behavior disorder     REM  . Osteoarthritis     RIGHT KNEE  . Syncope and collapse 02/2010    HOSPITALIZED  . Anxiety     STRESS AND MENOPAUSAL SYMPTOMS  . Memory loss   . Hearing loss   . Memory problem   . Hearing difficulty     DECREASED HEARING  . Pneumonia 03/2014  . Advanced dementia 05/30/2014  . Clinical depression 05/30/2014    Past Surgical History  Procedure Laterality Date  . Left upper lobe resection  04/2010  . Appendectomy    . Thyroidectomy    . Cholecystectomy    . Vaginal hysterectomy    . Hemorrhoid surgery    . Back surgery    . Knee surgery      RIGHT SIDE    Current Outpatient Prescriptions  Medication Sig Dispense Refill  . aspirin 81 MG tablet Take 81 mg by mouth daily.      . Calcium Carbonate (CALCIUM 600 PO) Take by mouth. 1 po daily      . Cholecalciferol (VITAMIN D) 1000 UNITS capsule Take 5,000 Units by mouth daily.     . diazepam (VALIUM) 5 MG tablet Take 5 mg by mouth 2 (two) times daily. 1/2 two times daily in afternoon    .  diclofenac (VOLTAREN) 75 MG EC tablet Take 75 mg by mouth daily.    Marland Kitchen donepezil (ARICEPT) 10 MG tablet TAKE 1 TABLET (10 MG TOTAL) BY MOUTH 2 (TWO) TIMES DAILY. 180 tablet 1  . FLUoxetine (PROZAC) 40 MG capsule Take 40 mg by mouth daily.      Marland Kitchen glucosamine-chondroitin 500-400 MG tablet Take 1 tablet by mouth daily.      Marland Kitchen HYDROcodone-acetaminophen (NORCO/VICODIN) 5-325 MG per tablet Take 1 tablet by mouth every 6 (six) hours as needed for moderate pain.    Marland Kitchen levothyroxine (SYNTHROID, LEVOTHROID) 100 MCG tablet Take 100 mcg by mouth.     . meclizine (ANTIVERT) 25 MG tablet Take 25 mg by mouth as needed.      . memantine (NAMENDA) 10 MG tablet Take 1 tablet (10 mg total) by mouth 2 (two) times daily. 180 tablet 3  . omeprazole (PRILOSEC) 20 MG capsule Take 20 mg by mouth 2 (two) times daily.      . promethazine (PHENERGAN) 25 MG tablet Take 25 mg by mouth every 6 (six) hours as needed.      . vitamin E 100 UNIT capsule Take 100 Units by mouth 2 (two) times daily.     No current facility-administered medications for this visit.    Allergies as of 09/12/2014 - Review Complete 09/12/2014  Allergen Reaction Noted  . Codeine  09/11/2010    Vitals: BP 132/68 mmHg  Pulse 64  Ht 5' 5.75" (1.67 m)  Wt 189 lb (85.73 kg)  BMI 30.74 kg/m2 Last Weight:  Wt Readings from Last 1 Encounters:  09/12/14 189 lb (85.73 kg)   Last Height:   Ht Readings from Last 1 Encounters:  09/12/14 5' 5.75" (1.67 m)   Vision Screening:  Physical exam:  General: The patient is awake, alert and appears not in acute distress. The patient is well groomed.  She has a history of progressive memory loss , she endorsed the Epworth sleepiness score of 18 points and score today on an and MMSE 16 out of 30 points.  Head:  Normocephalic, atraumatic. Neck is supple. Cardiovascular:  Regular rate and rhythm, without  murmurs or carotid bruit, and without distended neck veins. Respiratory: Lungs are clear to auscultation. Skin:  Without evidence of edema, or rash Trunk: BMI is elevated ,  normal posture.  Neurologic exam : The patient is awake and alert, oriented to place and time.  Memory  Severely disturbed -. Advanced memory loss.  There is a normal attention span & concentration ability, but she forgets quickly. Speech is non- fluent with dysarthria, dysphonia ,  aphasia. Mood and affect are defensive  Cranial nerves: Pupils are equal and briskly reactive to light.Extraocular movements  in vertical and horizontal planes intact and without nystagmus. Visual fields by finger perimetry are intact. Hearing to finger rub intact.  Facial sensation intact to fine touch.  Facial motor strength is symmetric and tongue and uvula move midline.  Motor exam: Normal tone and normal muscle bulk and symmetric normal strength in all extremities.  Sensory:  Fine touch, pinprick and vibration were tested in all extremities.  Proprioception is tested in the upper extremities only. - normal.  Coordination: communication related difficulties to test- Finger-to-nose maneuver tested with evidence of ataxia, dysmetria   Gait and station: Patient walks without assistive device . Strength within normal limits. Stance is stable and normal.  Tandem gaitunfragmented. Romberg testing is normal.  Deep tendon reflexes: in the  upper and lower extremities are  symmetric and intact. Babinski maneuver response is downgoing.   Assessment:  After physical and neurologic examination, review of laboratory studies, imaging, neurophysiology testing and pre-existing records, assessment :  This patient, with otherwise an excellent physical health, presents with progressive memory loss but scored today 20 out of 30 points, better than last  visit!Marland Kitchen  When we evaluated her in the past for hypersomnia, there was no evidence of sleep disordered breathing found and neither at least not for the night of testing was there any parasomnia noted.  The patient continues to complain of excessive daytime sleepiness and very irregular sleep wake rhythms and daytime naps. She sleeps up to 10-12 hours daily.   Her memory tests has progressively declined - The first MSSE test scored 23/30 points in early 2011,. a brain MRI was obtained which showed no abnormalities,  just some generalized brain atrophy.  She is now in an advanced stage of Alzheimer's Demntia, needs more assistance  due to her cognitive decline , not due to her physical needs.  She would forget to eat regularly therefore she has to be reminded and meals have to be prepared for her of course, she also needs some toileting and dressing assistance and especially at night has developed enuresis. It will be beneficial for her caretakers if the patient would have a hospital bed that would allow them to change the patient without bending download. We also discussed a handlebar next to the past to poor into the shower tub and some other possibilities to prevent falls in the future and would like a home safety physical therapy evaluation and so or a nursing evaluation for the same reason. I'm not aware how much Medicare will contribute I didn't I do know however that this patient would be better cared for in the private home but in any institutional environment.  A hospital bed would allow her daughters and her husband to continue caring for her without physical injury to them/ himself. Her labs were indicating a renal insufficiency, and her chest CT at that time showed a concerning 2 centimeter by 2 cm lesion that had been followed up with a PET scan - her pulmonologist declared the node cancerous , Surgery followed and Pulmonology  is regularly following that.       09-12-14  Change to  namziric.  The patient's depression score is elevated today at 7 points she is alert awake during the exam time she performed today on a Mini-Mental test 17 out of 30 points area this is 09-12-14. She was not able to do the repetition recall plan of the 3D date recall words. But she was able to copy an object she did not know the exact date and clock face has a normal contour but neither the numbers nor the hands of the clock for correctly placed.  Her AFT was 7.  The patient is not longer driving, after getting lost on a familiar way to visit her sister.   Day time assistance provided by her 2 daughters, needs help with meal prep and shower.   Supervision needed.      09-12-14 Mrs. Wiedman now has access to a hospital bed at home. She is aware of the diagnosis here today and she asked me how Alzheimer's disease develops and if there is anything in the pipeline to treated better the future. With her daughter today I discussed the typical change her to numb 0 her daughter brought up BX R form of Namenda. The pharmaceutical representative  for the numb direct medication had announced that the combination product would be cheaper than prescribing  both products but themselves. Plan:  Treatment plan and additional workup:  Change to namziric.  The patient's depression score is elevated today at 7 points she is alert awake during the exam time she performed today on a Mini-Mental test 17 out of 30 points area this is 09-12-14. She was not able to do the repetition recall plan of the 3D date recall words. But she was able to copy an object she did not know the exact date and clock face has a normal contour but neither the numbers nor the hands of the clock for correctly placed.  Her AFT was 7.  She is taking 23 mg of Aricept and tolerated this medication rather well. She is already taking Namenda 10 mg twice a day as well.her daughter asked for [facility] and I am happy to change her to it, once a day and  cheaper than both meds per se .   I do not suspect a paraneoplastic impact on her memory loss. She also did not undergo radiation or chemotherapy. I strongly suspect that this is the later stages of dementia dementia Alzheimer's disease.

## 2014-09-12 NOTE — Patient Instructions (Signed)
Donepezil; Memantine extended-release capsule What is this medicine? DONEPEZIL; MEMANTINE (doe NEP e zil; MEM an teen) is used to treat dementia caused by Alzheimer's disease. This medicine may be used for other purposes; ask your health care provider or pharmacist if you have questions. COMMON BRAND NAME(S): Namzaric What should I tell my health care provider before I take this medicine? They need to know if you have any of these conditions: -difficulty passing urine -head injury -heart disease -irregular heartbeat -kidney disease -liver disease -lung or breathing disease, like asthma -seizures -stomach or intestinal disease, ulcers or stomach bleeding -an unusual or allergic reaction to donepezil, memantine, other medicines, foods, dyes, or preservatives -pregnant or trying to get pregnant -breast-feeding How should I use this medicine? Take this medicine by mouth with a glass of water. Follow the directions on the prescription label. You may take this medicine with or without food. Swallow the capsules whole. Do not chew or crush. If swallowing is difficult, you may open the capsules and sprinkle the entire contents on cool applesauce before swallowing. Take your doses at regular intervals. Do not take your medicine more often than directed. Continue to take your medicine even if you feel better. Do not stop taking except on the advice of your doctor or health care professional. Talk to your pediatrician regarding the use of this medicine in children. Special care may be needed. Overdosage: If you think you've taken too much of this medicine contact a poison control center or emergency room at once. Overdosage: If you think you have taken too much of this medicine contact a poison control center or emergency room at once. NOTE: This medicine is only for you. Do not share this medicine with others. What if I miss a dose? If you miss a dose, take it as soon as you can. If it is almost time  for your next dose, take only that dose. Do not take double or extra doses. If you do not take your medicine for several days, contact your health care provider. Your dose may need to be changed. What may interact with this medicine? Do not take this medicine with any of the following medications: -certain medicines for fungal infections like itraconazole, fluconazole, posaconazole, and voriconazole -cisapride -dextromethorphan; quinidine -dofetilide -dronedarone -pimozide -quinidine -thioridazine -ziprasidone This medicine may also interact with the following medications: -acetazolamide -antihistamines for allergy, cough and cold -atropine -bethanechol -carbamazepine -certain medicines for bladder problems like oxybutynin, tolterodine -certain medicines for Parkinson's disease like benztropine, trihexyphenidyl -certain medicines for stomach problems like dicyclomine, hyoscyamine -certain medicines for travel sickness like scopolamine -cimetidine -dexamethasone -hydrochlorothiazide (HCTZ) -ketamine -ipratropium -metformin -methazolamide -nicotine -NSAIDs, medicines for pain and inflammation, like ibuprofen or naproxen -other medicines for Alzheimer's disease -other medicines that prolong the QT interval (cause an abnormal heart rhythm) -phenobarbital -phenytoin -ranitidine -rifampin, rifabutin or rifapentine -sodium bicarbonate -succinylcholine -triamterene This list may not describe all possible interactions. Give your health care provider a list of all the medicines, herbs, non-prescription drugs, or dietary supplements you use. Also tell them if you smoke, drink alcohol, or use illegal drugs. Some items may interact with your medicine. What should I watch for while using this medicine? Visit your doctor or health care professional for regular checks on your progress. Check with your doctor or health care professional if there is no improvement in your symptoms or if they  get worse. You may get drowsy or dizzy. Do not drive, use machinery, or do anything that needs mental alertness until  you know how this drug affects you. Do not stand or sit up quickly, especially if you are an older patient. This reduces the risk of dizzy or fainting spells. Alcohol can make you more drowsy and dizzy. Avoid alcoholic drinks. If you are going to need surgery or other procedure, tell your doctor that you are using this medicine. What side effects may I notice from receiving this medicine? Side effects that you should report to your doctor or health care professional as soon as possible: -allergic reactions like skin rash, itching or hives, swelling of the face, lips, or tongue -changes in vision -depressed mood -feeling faint or lightheaded, falls -hallucinations -problems with balance -redness, blistering, peeling or loosening of the skin, including inside the mouth -seizures -slow heartbeat, or palpitations -stomach pain -unusual bleeding or bruising, red or purple spots on the skin -vomiting -weight loss Side effects that usually do not require medical attention (Report these to your doctor or health care professional if they continue or are bothersome): -diarrhea -dizziness -headache -indigestion or heartburn -loss of appetite -nausea This list may not describe all possible side effects. Call your doctor for medical advice about side effects. You may report side effects to FDA at 1-800-FDA-1088. Where should I keep my medicine? Keep out of the reach of children. Store at room temperature between 15 and 30 degrees C (59 and 86 degrees F). Throw away any unused medicine after the expiration date. NOTE: This sheet is a summary. It may not cover all possible information. If you have questions about this medicine, talk to your doctor, pharmacist, or health care provider.  2015, [employer]. (2013-08-03 11:38:41)

## 2014-12-23 ENCOUNTER — Telehealth: Payer: Self-pay | Admitting: Neurology

## 2014-12-23 NOTE — Telephone Encounter (Signed)
Suzanne Fritz called requesting to speak to Dr. Brett Fairy, only needs 5 minutes of her time. I advised that I would send a message but we would need a HIPPA form on file in order to talk to her about her mom. She states that she is POA and has the paperwork.

## 2015-01-17 ENCOUNTER — Encounter: Payer: Self-pay | Admitting: Neurology

## 2015-01-17 ENCOUNTER — Ambulatory Visit (INDEPENDENT_AMBULATORY_CARE_PROVIDER_SITE_OTHER): Payer: Medicare Other | Admitting: Neurology

## 2015-01-17 VITALS — BP 138/62 | HR 76 | Resp 20 | Ht 65.75 in | Wt 184.0 lb

## 2015-01-17 DIAGNOSIS — G309 Alzheimer's disease, unspecified: Secondary | ICD-10-CM | POA: Diagnosis not present

## 2015-01-17 DIAGNOSIS — F0280 Dementia in other diseases classified elsewhere without behavioral disturbance: Secondary | ICD-10-CM
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
## 2015-01-30 NOTE — Addendum Note (Signed)
Addended by: Rance Muir R on: 01/30/2015 05:16 PM   Modules accepted: Orders

## 2015-02-14 ENCOUNTER — Telehealth: Payer: Self-pay | Admitting: Neurology

## 2015-02-14 DIAGNOSIS — F0280 Dementia in other diseases classified elsewhere without behavioral disturbance: Secondary | ICD-10-CM

## 2015-02-14 DIAGNOSIS — G309 Alzheimer's disease, unspecified: Principal | ICD-10-CM

## 2015-02-14 NOTE — Telephone Encounter (Signed)
[facility] returned your call regarding Jackye Dever [phone].

## 2015-02-14 NOTE — Telephone Encounter (Signed)
Spoke to pt's daughter. She wants another referral for social work that Dr. Brett Fairy had put in for on 9/20. Pt's daughter declined social work when we offered it the first time. I explained that social work will help her with financial resources, helps with caregiver burnout, support groups, etc. Pt's daughter says that this is what they need help with. I advised her that we would put in again for another referral to social work. Pt's daughter verbalized understanding.

## 2015-02-14 NOTE — Telephone Encounter (Signed)
I called pt's daughter to find out what kind of "home help" they need. I left a message asking her to call me back.

## 2015-02-14 NOTE — Telephone Encounter (Signed)
Pt's daughter called and states that there was a referral put in for at home help, she thinks. She is wondering if that is still available and if not would another referral be put in. Please call and advise [phone]

## 2015-02-15 ENCOUNTER — Telehealth: Payer: Self-pay | Admitting: Neurology

## 2015-02-15 NOTE — Telephone Encounter (Signed)
Called [facility] social service about a referral I can speak to them when they call. Thanks Hinton Dyer.

## 2015-03-15 ENCOUNTER — Ambulatory Visit: Payer: Medicare Other | Admitting: Neurology

## 2015-05-25 ENCOUNTER — Ambulatory Visit: Payer: Medicare Other | Admitting: Adult Health

## 2015-06-12 ENCOUNTER — Ambulatory Visit (INDEPENDENT_AMBULATORY_CARE_PROVIDER_SITE_OTHER): Payer: Medicare Other | Admitting: Neurology

## 2015-06-12 ENCOUNTER — Encounter: Payer: Self-pay | Admitting: Neurology

## 2015-06-12 VITALS — BP 136/70 | HR 70 | Resp 20 | Ht 65.0 in | Wt 179.0 lb

## 2015-06-12 DIAGNOSIS — F0281 Dementia in other diseases classified elsewhere with behavioral disturbance: Secondary | ICD-10-CM | POA: Diagnosis not present

## 2015-06-12 DIAGNOSIS — N3942 Incontinence without sensory awareness: Secondary | ICD-10-CM

## 2015-06-12 DIAGNOSIS — G308 Other Alzheimer's disease: Secondary | ICD-10-CM | POA: Diagnosis not present

## 2015-06-12 DIAGNOSIS — R159 Full incontinence of feces: Secondary | ICD-10-CM | POA: Diagnosis not present

## 2015-06-12 DIAGNOSIS — G309 Alzheimer's disease, unspecified: Principal | ICD-10-CM

## 2015-06-12 MED ORDER — ALPRAZOLAM 0.5 MG PO TABS: 0.5000 mg | ORAL_TABLET | Freq: Two times a day (BID) | ORAL | Status: DC | PRN

## 2015-06-12 NOTE — Patient Instructions (Signed)
Hospice Hospice is a service that is designed to provide people who are terminally ill and their families with medical, spiritual, and psychological support. Its aim is to improve your quality of life by keeping you as alert and comfortable as possible. Hospice is performed by a team of health care professionals and volunteers who:  Help keep you comfortable. Hospice can be provided in your home or in a homelike setting. The hospice staff works with your family and friends to help meet your needs. You will enjoy the support of loved ones by receiving much of your basic care from family and friends.  Provide pain relief and manage your symptoms. The staff supply all necessary medicines and equipment.  Provide companionship when you are alone.  Allow you and your family to rest. They may do light housekeeping, prepare meals, and run errands.  Provide counseling. They will make sure your emotional, spiritual, and social needs and those of your family are being met.  Provide spiritual care. Spiritual care is individualized to meet your needs and your family's needs. It may involve helping you look at what death means to you, say goodbye, or perform a specific religious ceremony or ritual. Hospice teams often include:  A nurse.  A doctor.  Social workers.  Religious leaders (such as a chaplain).  Trained volunteers. WHEN SHOULD HOSPICE CARE BEGIN? Most people who use hospice are believed to have fewer than 6 months to live. Your family and health care providers can help you decide when hospice services should begin. If your condition improves, you may discontinue the program. WHAT SHOULD I CONSIDER BEFORE SELECTING A PROGRAM? Most hospice programs are run by nonprofit, independent organizations. Some are affiliated with hospitals, nursing homes, or home health care agencies. Hospice programs can take place in the home or at a hospice center, hospital, or skilled nursing facility. When choosing  a hospice program, ask the following questions:  What services are available to me?  What services are offered to my loved ones?  How involved are my loved ones?  How involved is my health care provider?  Who makes up the hospice care team? How are they trained or screened?  How will my pain and symptoms be managed?  If my circumstances change, can the services be provided in a different setting, such as my home or in the hospital?  Is the program reviewed and licensed by the state or certified in some other way? WHERE CAN I LEARN MORE ABOUT HOSPICE? You can learn about existing hospice programs in your area from your health care providers. You can also read more about hospice online. The websites of the following organizations contain helpful information:  The National Hospice and Palliative Care Organization (NHPCO).  The Hospice Association of America (HAA).  The Hospice Education Institute.  The American Cancer Society (ACS).  Hospice Net.   This information is not intended to replace advice given to you by your health care provider. Make sure you discuss any questions you have with your health care provider.   Document Released: 08/02/2003 Document Revised: 04/20/2013 Document Reviewed: 02/23/2013 Elsevier Interactive Patient Education 2016 Elsevier Inc.  

## 2015-06-12 NOTE — Addendum Note (Signed)
Addended by: Larey Seat on: 06/12/2015 10:31 AM   Modules accepted: Orders, Medications
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
## 2015-07-05 ENCOUNTER — Telehealth: Payer: Self-pay | Admitting: Adult Health

## 2015-07-05 DIAGNOSIS — F4489 Other dissociative and conversion disorders: Secondary | ICD-10-CM

## 2015-07-05 MED ORDER — LORAZEPAM 0.5 MG PO TABS: 0.5000 mg | ORAL_TABLET | Freq: Every day | ORAL | Status: DC

## 2015-07-05 NOTE — Telephone Encounter (Signed)
Please advise 

## 2015-07-05 NOTE — Telephone Encounter (Signed)
I called and spoke to the patient's daughter. She states that the patient is not sleeping at all during the night. The caregiver states that she's been up all night. The patient is also having episodes of agitation and aggressiveness. She states that the Xanax has not been beneficial at all. I will switch the patient had Ativan 0.5 mg at bedtime. I have reviewed the side effects as well as the risk associated with this medication-including the risk of falls. She verbalized understanding. If she is unable to tolerate this medication or she finds it not beneficial she was advised to let us know.

## 2015-07-05 NOTE — Telephone Encounter (Signed)
Daughter Fraser Din [phone] called regarding ALPRAZolam Suzanne Fritz) 0.5 MG tablet to help patient sleep, states it doesn't work, is wondering if the medication could be changed to something else.

## 2015-07-07 ENCOUNTER — Other Ambulatory Visit: Payer: Self-pay | Admitting: Neurology

## 2015-07-12 ENCOUNTER — Other Ambulatory Visit: Payer: Self-pay

## 2015-07-12 MED ORDER — OLANZAPINE 5 MG PO TABS: 5.0000 mg | ORAL_TABLET | Freq: Every day | ORAL | Status: DC

## 2015-07-12 NOTE — Telephone Encounter (Signed)
I spoke to pt's daughter and advised her that I have already sent the message to Dr. Brett Fairy but she is with pts all day today and she will respond with advice as soon as she is able to and I will call her back as soon as I can. Pt's daughter really wants something to be done today.

## 2015-07-12 NOTE — Telephone Encounter (Signed)
Spoke to pt's daughter. I advised her that Dr. Brett Fairy prescribed zyprexa for the pt, and this is a rescue medication when a patient cannot sleep and is actively hallucinating. I advised her that there is a black box warning for zyprexa on use in seniors, but this is Dr. Edwena Felty best shot at pt's current state of confusion. Pt's daughter verbalized understanding. I advised her that the Rx [facility] [place].

## 2015-07-12 NOTE — Telephone Encounter (Signed)
Ativan may have a paradox effect on her, I know that there is a black  box on Seroquel, but it is one of my rescue medications when a patient cannot sleep and is actively hallucinating.  I will write for a relation of seroquel , ZYPREXA.

## 2015-07-12 NOTE — Telephone Encounter (Signed)
Wrote for olanzepine , 5 mg at night, can be used bid to calm confused and agitated patient. There is a bLACk BOX warning on use in seniors, but this is my best shot at her state of confusion. CD

## 2015-07-12 NOTE — Telephone Encounter (Signed)
Daughter Marieclaire Bettenhausen [phone] called back, is afraid that they won't be able to get anything for Mother and we will close after 5pm, states "they need something NOW".

## 2015-07-12 NOTE — Addendum Note (Signed)
Addended by: Larey Seat on: 07/12/2015 02:18 PM   Modules accepted: Orders

## 2015-07-12 NOTE — Telephone Encounter (Signed)
Spoke to pt's daughter. After trying the ativan, pt's daughter reports pt's sleeplessness was worse. She constantly wanders the house and pt's daughter is concerned for her safety. Pt's daughter says that the pt may sleep 15 minutes during the day but that's it. Pt's daughter can tell that the pt is very tired, but she just can't sleep. Pt's family is very tired as well. Pt's daughter states that her father takes Lorrin Mais to help him sleep, and maybe this is something that can help her mother? Pt's daughter is begging [place] for a call back today with something they can do to help the pt go to sleep. I advised her that I would send this message to Dr. Brett Fairy and I would call her back as soon as I could.

## 2015-07-12 NOTE — Telephone Encounter (Signed)
Pt's daughter called stating the Ativan is not helping her mother sleep. It seems to actually be making it worse. She says that it has been a week and it is not working. The pt is wondering around the house all night. Pt may take a 15 min nap during the day but over all she is not sleeping. Please call and advise

## 2015-07-17 ENCOUNTER — Ambulatory Visit: Payer: Medicare Other | Admitting: Adult Health

## 2015-07-24 ENCOUNTER — Telehealth: Payer: Self-pay | Admitting: Adult Health

## 2015-07-24 DIAGNOSIS — F4489 Other dissociative and conversion disorders: Secondary | ICD-10-CM

## 2015-07-24 MED ORDER — OLANZAPINE 5 MG PO TABS: 5.0000 mg | ORAL_TABLET | Freq: Every day | ORAL | Status: DC

## 2015-07-24 NOTE — Telephone Encounter (Signed)
Pt's daughter has called saying that she has been giving her mother, the pt, 2 pills of OLANZapine (ZYPREXA) 5 MG tablet at bed time and it has been working great. The rx is written for 1 pill at night though. They will run out of medication by Thursday if she continues with 2 at night. Daughter wants to know if the dosage could be increased, if they could have more refills also. Please call and advise [phone]

## 2015-07-24 NOTE — Telephone Encounter (Signed)
Dr. Brett Fairy wrote for zyprexa.

## 2015-07-24 NOTE — Telephone Encounter (Signed)
[facility]rexa RX [facility] to [facility] [place]. Received a receipt of confirmation.

## 2015-09-07 ENCOUNTER — Emergency Department (HOSPITAL_COMMUNITY): Payer: Medicare Other

## 2015-09-07 ENCOUNTER — Emergency Department (HOSPITAL_COMMUNITY)
Admission: EM | Admit: 2015-09-07 | Discharge: 2015-09-07 | Disposition: A | Discharge status: Home / Self Care | Payer: Medicare Other | Attending: Emergency Medicine | Admitting: Emergency Medicine

## 2015-09-07 ENCOUNTER — Encounter (HOSPITAL_COMMUNITY): Payer: Self-pay | Admitting: Emergency Medicine

## 2015-09-07 DIAGNOSIS — E785 Hyperlipidemia, unspecified: Secondary | ICD-10-CM | POA: Insufficient documentation

## 2015-09-07 DIAGNOSIS — S60221A Contusion of right hand, initial encounter: Secondary | ICD-10-CM | POA: Diagnosis not present

## 2015-09-07 DIAGNOSIS — Y939 Activity, unspecified: Secondary | ICD-10-CM | POA: Insufficient documentation

## 2015-09-07 DIAGNOSIS — W228XXA Striking against or struck by other objects, initial encounter: Secondary | ICD-10-CM | POA: Insufficient documentation

## 2015-09-07 DIAGNOSIS — E039 Hypothyroidism, unspecified: Secondary | ICD-10-CM | POA: Diagnosis not present

## 2015-09-07 DIAGNOSIS — R609 Edema, unspecified: Secondary | ICD-10-CM

## 2015-09-07 DIAGNOSIS — Z79899 Other long term (current) drug therapy: Secondary | ICD-10-CM | POA: Diagnosis not present

## 2015-09-07 DIAGNOSIS — Y929 Unspecified place or not applicable: Secondary | ICD-10-CM | POA: Insufficient documentation

## 2015-09-07 DIAGNOSIS — M1990 Unspecified osteoarthritis, unspecified site: Secondary | ICD-10-CM | POA: Insufficient documentation

## 2015-09-07 DIAGNOSIS — I10 Essential (primary) hypertension: Secondary | ICD-10-CM | POA: Insufficient documentation

## 2015-09-07 DIAGNOSIS — Y999 Unspecified external cause status: Secondary | ICD-10-CM | POA: Insufficient documentation

## 2015-09-07 DIAGNOSIS — F329 Major depressive disorder, single episode, unspecified: Secondary | ICD-10-CM | POA: Diagnosis not present

## 2015-09-07 DIAGNOSIS — S6991XA Unspecified injury of right wrist, hand and finger(s), initial encounter: Secondary | ICD-10-CM | POA: Diagnosis present

## 2015-09-07 LAB — URINALYSIS, ROUTINE W REFLEX MICROSCOPIC
BILIRUBIN URINE: NEGATIVE
GLUCOSE, UA: NEGATIVE mg/dL
HGB URINE DIPSTICK: NEGATIVE
Ketones, ur: NEGATIVE mg/dL
Nitrite: NEGATIVE
Protein, ur: NEGATIVE mg/dL
SPECIFIC GRAVITY, URINE: 1.01 (ref 1.005–1.030)
pH: 6.5 (ref 5.0–8.0)

## 2015-09-07 LAB — URINE MICROSCOPIC-ADD ON: RBC / HPF: NONE SEEN RBC/hpf (ref 0–5)

## 2015-09-07 LAB — CBC WITH DIFFERENTIAL/PLATELET
Basophils Absolute: 0.1 10*3/uL (ref 0.0–0.1)
Basophils Relative: 1 %
Eosinophils Absolute: 0.5 10*3/uL (ref 0.0–0.7)
Eosinophils Relative: 6 %
HCT: 38.2 % (ref 36.0–46.0)
HEMOGLOBIN: 12.2 g/dL (ref 12.0–15.0)
LYMPHS ABS: 2 10*3/uL (ref 0.7–4.0)
Lymphocytes Relative: 25 %
MCH: 29.3 pg (ref 26.0–34.0)
MCHC: 31.9 g/dL (ref 30.0–36.0)
MCV: 91.8 fL (ref 78.0–100.0)
MONOS PCT: 11 %
Monocytes Absolute: 0.9 10*3/uL (ref 0.1–1.0)
NEUTROS PCT: 57 %
Neutro Abs: 4.5 10*3/uL (ref 1.7–7.7)
Platelets: 225 10*3/uL (ref 150–400)
RBC: 4.16 MIL/uL (ref 3.87–5.11)
RDW: 13.9 % (ref 11.5–15.5)
WBC: 8 10*3/uL (ref 4.0–10.5)

## 2015-09-07 LAB — COMPREHENSIVE METABOLIC PANEL
ALT: 16 U/L (ref 14–54)
AST: 27 U/L (ref 15–41)
Albumin: 3.8 g/dL (ref 3.5–5.0)
Alkaline Phosphatase: 109 U/L (ref 38–126)
Anion gap: 9 (ref 5–15)
BUN: 21 mg/dL — AB (ref 6–20)
CHLORIDE: 107 mmol/L (ref 101–111)
CO2: 25 mmol/L (ref 22–32)
Calcium: 9.8 mg/dL (ref 8.9–10.3)
Creatinine, Ser: 1.04 mg/dL — ABNORMAL HIGH (ref 0.44–1.00)
GFR calc Af Amer: 56 mL/min — ABNORMAL LOW (ref >60)
GFR calc non Af Amer: 49 mL/min — ABNORMAL LOW (ref >60)
GLUCOSE: 103 mg/dL — AB (ref 65–99)
POTASSIUM: 4.3 mmol/L (ref 3.5–5.1)
Sodium: 141 mmol/L (ref 135–145)
Total Bilirubin: 0.5 mg/dL (ref 0.3–1.2)
Total Protein: 7.5 g/dL (ref 6.5–8.1)

## 2015-09-07 NOTE — Discharge Instructions (Signed)
Take your usual prescriptions as previously directed.  Apply moist heat or ice to the area(s) of discomfort, for 15 minutes at a time, several times per day for the next few days.  Do not fall asleep on a heating or ice pack.  Call your regular medical doctor tomorrow to schedule a follow up appointment this week.  Return to the Emergency Department immediately if worsening.

## 2015-09-07 NOTE — ED Provider Notes (Signed)
CSN: [id number]     Arrival date & time 09/07/15  1314 History   First MD Initiated Contact with Patient 09/07/15 1602     Chief Complaint  Patient presents with  . Hand Pain      HPI Pt was seen at 1605.  Per pt and her family, c/o gradual onset and resolution of one brief episode of right hand "swelling" that was noticed when pt woke up this morning. Pt's swelling improved before she was evaluated at a local [place]. Local [facility] told pt to come to the ED "to get an ultrasound to r/o a blood clot." Pt endorses she "might have hit my hand" yesterday. Denies fevers, no rash, no deformity, no focal motor weakness, no tingling/numbness in extremities, no pain.    Past Medical History  Diagnosis Date  . Hypercholesterolemia   . Depression   . Hypertension   . GERD (gastroesophageal reflux disease)   . Peripheral neuropathy ([place])   . Dementia   . Hypothyroidism   . Adenocarcinoma ([place])   . Lung cancer ([place]) 04/2010    upper lobe removed   . Alzheimer disease   . Arthritis   . Back pain   . Hyperlipidemia   . Migraine   . Behavior disorder     REM  . Osteoarthritis     RIGHT KNEE  . Syncope and collapse 02/2010    HOSPITALIZED  . Anxiety     STRESS AND MENOPAUSAL SYMPTOMS  . Memory loss   . Hearing loss   . Memory problem   . Hearing difficulty     DECREASED HEARING  . Pneumonia 03/2014  . Advanced dementia 05/30/2014  . Clinical depression 05/30/2014   Past Surgical History  Procedure Laterality Date  . Left upper lobe resection  04/2010  . Appendectomy    . Thyroidectomy    . Cholecystectomy    . Vaginal hysterectomy    . Hemorrhoid surgery    . Back surgery    . Knee surgery      RIGHT SIDE   Family History  Problem Relation Age of Onset  . Stroke Mother 22  . Cancer Father 59    Stomach  . Coronary artery disease Brother     2 brothers , but alive  . Migraines Child    Social History  Substance Use Topics  . Smoking status: Former Smoker    Quit date:  04/29/1974  . Smokeless tobacco: Never Used  . Alcohol Use: No    Review of Systems ROS: Statement: All systems negative except as marked or noted in the HPI; Constitutional: Negative for fever and chills. ; ; Eyes: Negative for eye pain, redness and discharge. ; ; ENMT: Negative for ear pain, hoarseness, nasal congestion, sinus pressure and sore throat. ; ; Cardiovascular: Negative for chest pain, palpitations, diaphoresis, dyspnea and peripheral edema. ; ; Respiratory: Negative for cough, wheezing and stridor. ; ; Gastrointestinal: Negative for nausea, vomiting, diarrhea, abdominal pain, blood in stool, hematemesis, jaundice and rectal bleeding. . ; ; Genitourinary: Negative for dysuria, flank pain and hematuria. ; ; Musculoskeletal: +right hand swelling. Negative for back pain and neck pain. Negative for deformity; ; Skin: +bruising. Negative for pruritus, rash, abrasions, blisters, and skin lesion.; ; Neuro: Negative for headache, lightheadedness and neck stiffness. Negative for weakness, altered level of consciousness, altered mental status, extremity weakness, paresthesias, involuntary movement, seizure and syncope.    Allergies  Codeine  Home Medications   Prior to Admission medications  Medication Sig Start Date End Date Taking? Authorizing Provider  aspirin 81 MG tablet Take 81 mg by mouth daily.      Historical Provider, MD  Calcium Carbonate (CALCIUM 600 PO) Take by mouth. 1 po daily     Historical Provider, MD  Cholecalciferol (VITAMIN D) 1000 UNITS capsule Take 5,000 Units by mouth daily.     Historical Provider, MD  diclofenac (VOLTAREN) 75 MG EC tablet Take 75 mg by mouth daily. 10/15/13   Historical Provider, MD  FLUoxetine (PROZAC) 40 MG capsule Take 40 mg by mouth daily.      Historical Provider, MD  glucosamine-chondroitin 500-400 MG tablet Take 1 tablet by mouth daily.      Historical Provider, MD  HYDROcodone-acetaminophen (NORCO/VICODIN) 5-325 MG per tablet Take 1 tablet by  mouth every 6 (six) hours as needed for moderate pain.    Historical Provider, MD  levothyroxine (SYNTHROID, LEVOTHROID) 100 MCG tablet Take 100 mcg by mouth.     Historical Provider, MD  LORazepam (ATIVAN) 0.5 MG tablet Take 1 tablet (0.5 mg total) by mouth at bedtime. 07/05/15   Ward Givens, NP  NAMZARIC 28-10 MG CP24 TAKE 1 CAPSULE BEFORE BREAKFAST 07/10/15   Larey Seat, MD  OLANZapine (ZYPREXA) 5 MG tablet Take 1 tablet (5 mg total) by mouth at bedtime. 07/24/15   Asencion Partridge Dohmeier, MD  omeprazole (PRILOSEC) 20 MG capsule Take 20 mg by mouth 2 (two) times daily.      Historical Provider, MD  promethazine (PHENERGAN) 25 MG tablet Take 25 mg by mouth every 6 (six) hours as needed.      Historical Provider, MD  VESICARE 10 MG tablet TAKE ONE TABLET (10 MG TOTAL) BY MOUTH DAILY. 05/26/15   Historical Provider, MD   BP 207/71 mmHg  Pulse 66  Temp(Src) 97.6 F (36.4 C) (Temporal)  Resp 17  Ht '5\' 6"'$  (1.676 m)  Wt 180 lb (81.647 kg)  BMI 29.07 kg/m2  SpO2 100% Physical Exam  Physical examination:  Nursing notes reviewed; Vital signs and O2 SAT reviewed;  Constitutional: Well developed, Well nourished, Well hydrated, In no acute distress; Head:  Normocephalic, atraumatic; Eyes: EOMI, PERRL, No scleral icterus; ENMT: Mouth and pharynx normal, Mucous membranes moist; Neck: Supple, Full range of motion, No lymphadenopathy; Cardiovascular: Regular rate and rhythm, No gallop; Respiratory: Breath sounds clear & equal bilaterally, No wheezes.  Speaking full sentences with ease, Normal respiratory effort/excursion; Chest: Nontender, Movement normal; Abdomen: Soft, Nontender, Nondistended, Normal bowel sounds; Genitourinary: No CVA tenderness; Extremities: Pulses normal, +small ecchymosis to right dorsal hand, no open wounds, no erythema, no edema, no deformity. NT right shoulder/elbow/wrist/hand/fingers. Strong radial pulse, brisk cap refill in fingertips..; Neuro: AA&Ox3, Major CN grossly intact.  Speech  clear. No gross focal motor or sensory deficits in extremities.; Skin: Color normal, Warm, Dry.     ED Course  Procedures (including critical care time) Labs Review  Imaging Review  I have personally reviewed and evaluated these images and lab results as part of my medical decision-making.   EKG Interpretation None      MDM  MDM Reviewed: previous chart, nursing note and vitals Reviewed previous: labs Interpretation: labs, x-ray and ultrasound     Results for orders placed or performed during the hospital encounter of 09/07/15  Urinalysis, Routine w reflex microscopic (not at [facility])  Result Value Ref Range   Color, Urine YELLOW YELLOW   APPearance CLEAR CLEAR   Specific Gravity, Urine 1.010 1.005 - 1.030   pH 6.5  5.0 - 8.0   Glucose, UA NEGATIVE NEGATIVE mg/dL   Hgb urine dipstick NEGATIVE NEGATIVE   Bilirubin Urine NEGATIVE NEGATIVE   Ketones, ur NEGATIVE NEGATIVE mg/dL   Protein, ur NEGATIVE NEGATIVE mg/dL   Nitrite NEGATIVE NEGATIVE   Leukocytes, UA TRACE (A) NEGATIVE  CBC with Differential  Result Value Ref Range   WBC 8.0 4.0 - 10.5 K/uL   RBC 4.16 3.87 - 5.11 MIL/uL   Hemoglobin 12.2 12.0 - 15.0 g/dL   HCT 38.2 36.0 - 46.0 %   MCV 91.8 78.0 - 100.0 fL   MCH 29.3 26.0 - 34.0 pg   MCHC 31.9 30.0 - 36.0 g/dL   RDW 13.9 11.5 - 15.5 %   Platelets 225 150 - 400 K/uL   Neutrophils Relative % 57 %   Neutro Abs 4.5 1.7 - 7.7 K/uL   Lymphocytes Relative 25 %   Lymphs Abs 2.0 0.7 - 4.0 K/uL   Monocytes Relative 11 %   Monocytes Absolute 0.9 0.1 - 1.0 K/uL   Eosinophils Relative 6 %   Eosinophils Absolute 0.5 0.0 - 0.7 K/uL   Basophils Relative 1 %   Basophils Absolute 0.1 0.0 - 0.1 K/uL  Comprehensive metabolic panel  Result Value Ref Range   Sodium 141 135 - 145 mmol/L   Potassium 4.3 3.5 - 5.1 mmol/L   Chloride 107 101 - 111 mmol/L   CO2 25 22 - 32 mmol/L   Glucose, Bld 103 (H) 65 - 99 mg/dL   BUN 21 (H) 6 - 20 mg/dL   Creatinine, Ser 1.04 (H) 0.44 -  1.00 mg/dL   Calcium 9.8 8.9 - 10.3 mg/dL   Total Protein 7.5 6.5 - 8.1 g/dL   Albumin 3.8 3.5 - 5.0 g/dL   AST 27 15 - 41 U/L   ALT 16 14 - 54 U/L   Alkaline Phosphatase 109 38 - 126 U/L   Total Bilirubin 0.5 0.3 - 1.2 mg/dL   GFR calc non Af Amer 49 (L) >60 mL/min   GFR calc Af Amer 56 (L) >60 mL/min   Anion gap 9 5 - 15  Urine microscopic-add on  Result Value Ref Range   Squamous Epithelial / LPF 0-5 (A) NONE SEEN   WBC, UA 0-5 0 - 5 WBC/hpf   RBC / HPF NONE SEEN 0 - 5 RBC/hpf   Bacteria, UA RARE (A) NONE SEEN   US Venous Img Upper Uni Right 09/07/2015  CLINICAL DATA:  Right hand edema. EXAM: RIGHT UPPER EXTREMITY VENOUS DOPPLER ULTRASOUND TECHNIQUE: Gray-scale sonography with graded compression, as well as color Doppler and duplex ultrasound were performed to evaluate the upper extremity deep venous system from the level of the subclavian vein and including the jugular, axillary, basilic, radial, ulnar and upper cephalic vein. Spectral Doppler was utilized to evaluate flow at rest and with distal augmentation maneuvers. COMPARISON:  None. FINDINGS: Contralateral Subclavian Vein: Respiratory phasicity is normal and symmetric with the symptomatic side. No evidence of thrombus. Normal compressibility. Internal Jugular Vein: No evidence of thrombus. Normal compressibility, respiratory phasicity and response to augmentation. Subclavian Vein: No evidence of thrombus. Normal compressibility, respiratory phasicity and response to augmentation. Axillary Vein: No evidence of thrombus. Normal compressibility, respiratory phasicity and response to augmentation. Cephalic Vein: No evidence of thrombus. Normal compressibility, respiratory phasicity and response to augmentation. Basilic Vein: No evidence of thrombus. Normal compressibility, respiratory phasicity and response to augmentation. Brachial Veins: No evidence of thrombus. Normal compressibility, respiratory phasicity and response to augmentation.  Radial Veins: No evidence of thrombus. Normal compressibility, respiratory phasicity and response to augmentation. Ulnar Veins: No evidence of thrombus. Normal compressibility, respiratory phasicity and response to augmentation. Venous Reflux: No evidence of superficial thrombophlebitis or abnormal fluid collection. Other Findings:  None visualized. IMPRESSION: No evidence of right upper extremity deep venous thrombosis. Electronically Signed   By: Aletta Edouard M.D.   On: 09/07/2015 16:50   Dg Hand Complete Right 09/07/2015  CLINICAL DATA:  Acute right hand pain swelling without known injury. EXAM: RIGHT HAND - COMPLETE 3+ VIEW COMPARISON:  None. FINDINGS: No fracture or dislocation is noted. No soft tissue abnormality is noted. Severe degenerative changes seen involving the first carpometacarpal joint. Degenerative changes are also seen involving the proximal interphalangeal joints as well as the second and fifth distal interphalangeal joints. IMPRESSION: Findings consistent with osteoarthritis involving multiple joints as described above. No acute abnormality seen in the right hand. Electronically Signed   By: Marijo Conception, M.D.   On: 09/07/2015 14:06    1710:  Right hand and arm are without edema while in the ED. Family demanding labs, urine test and [place] "because we want to see if she's dehydrated, has a urine infection and [facility] told us to make sure she didn't have a blood clot in her hand." Family also requesting multiple other non-emergent testing; explained to pt's family role and scope of ED in pt's healthcare, and pt will need to f/u with PMD for further non-emergent testing. Pt's family verb understanding.  Workup reassuring. Pt wants to go home now. Tx symptomatically. Dx and testing d/w pt and family.  Questions answered.  Verb understanding, agreeable to d/c home with outpt f/u.   Francine Graven, DO 09/11/15 [phone]

## 2015-09-07 NOTE — ED Notes (Addendum)
Pt's daughter reports pt woke up this am with right hand swelling. Pt daughter reports swelling has subsided but reports urgent care told them to come to ED for ultrasound to rule out blood clot of right hand. Pt daughter denies pt being any blood thinners or any recent injury. nad noted. Radial pulses strong. Grip strong.   Pt family member reports wants urine sample checked due to hematuria noted this am. Pt family also reports "i think she may be dehydrated."

## 2015-10-24 ENCOUNTER — Other Ambulatory Visit: Payer: Self-pay | Admitting: Neurology

## 2015-11-09 ENCOUNTER — Encounter: Payer: Self-pay | Admitting: Adult Health

## 2015-11-09 ENCOUNTER — Ambulatory Visit (INDEPENDENT_AMBULATORY_CARE_PROVIDER_SITE_OTHER): Payer: Medicare Other | Admitting: Adult Health

## 2015-11-09 ENCOUNTER — Telehealth: Payer: Self-pay | Admitting: *Deleted

## 2015-11-09 DIAGNOSIS — F02818 Dementia in other diseases classified elsewhere, unspecified severity, with other behavioral disturbance: Secondary | ICD-10-CM

## 2015-11-09 DIAGNOSIS — G308 Other Alzheimer's disease: Secondary | ICD-10-CM

## 2015-11-09 DIAGNOSIS — F0281 Dementia in other diseases classified elsewhere with behavioral disturbance: Secondary | ICD-10-CM | POA: Diagnosis not present

## 2015-11-09 MED ORDER — OLANZAPINE 7.5 MG PO TABS: 7.5000 mg | ORAL_TABLET | Freq: Every day | ORAL | Status: DC

## 2015-11-09 NOTE — Progress Notes (Signed)
I agree with the assessment and plan as directed by NP .The patient is known to me .   Tresha Muzio, MD  

## 2015-11-09 NOTE — Telephone Encounter (Signed)
Pt FMLA form on [employer].
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
## 2015-11-09 NOTE — Patient Instructions (Addendum)
Continue namzaric  Zyprexa 7.5 mg at bedtime If your symptoms worsen or you develop new symptoms please let us know.   Olanzapine tablets What is this medicine? OLANZAPINE (oh LAN za peen) is used to treat schizophrenia, psychotic disorders, and bipolar disorder. Bipolar disorder is also known as manic-depression. This medicine may be used for other purposes; ask your health care provider or pharmacist if you have questions. What should I tell my health care provider before I take this medicine? They need to know if you have any of these conditions: -breast cancer or history of breast cancer -cigarette smoker -dementia -diabetes mellitus, high blood sugar or a family history of diabetes -difficulty swallowing -glaucoma -heart disease, irregular heartbeat, or previous heart attack -history of brain tumor or head injury -kidney or liver disease -low blood pressure or dizziness when standing up -Parkinson's disease -prostate trouble -seizures (convulsions) -suicidal thoughts, plans, or attempt by you or a family member -an unusual or allergic reaction to olanzapine, other medicines, foods, dyes, or preservatives -pregnant or trying to get pregnant -breast-feeding How should I use this medicine? Take this medicine by mouth. Swallow it with a drink of water. Follow the directions on the prescription label. Take your medicine at regular intervals. Do not take it more often than directed. Do not stop taking except on the advice of your doctor or health care professional. A special MedGuide will be given to you by the pharmacist with each new prescription and refill. Be sure to read this information carefully each time. Talk to your pediatrician regarding the use of this medicine in children. While this drug may be prescribed for children as young as 13 years for selected conditions, precautions do apply. Overdosage: If you think you have taken too much of this medicine contact a poison  control center or emergency room at once. NOTE: This medicine is only for you. Do not share this medicine with others. What if I miss a dose? If you miss a dose, take it as soon as you can. If it is almost time for your next dose, take only that dose. Do not take double or extra doses. What may interact with this medicine? Do not take this medicine with any of the following medications: -certain antibiotics like grepafloxacin and sparfloxacin -certain phenothiazines like chlorpromazine, mesoridazine, and thioridazine -cisapride -clozapine -droperidol -halofantrine -levomethadyl -pimozide This medicine may also interact with the following medications: -carbamazepine -charcoal -fluvoxamine -levodopa and other medicines for Parkinson's disease -medicines for diabetes -medicines for high blood pressure -medicines for mental depression, anxiety, other mood disorders, or sleeping problems -omeprazole -rifampin -ritonavir -tobacco from cigarettes This list may not describe all possible interactions. Give your health care provider a list of all the medicines, herbs, non-prescription drugs, or dietary supplements you use. Also tell them if you smoke, drink alcohol, or use illegal drugs. Some items may interact with your medicine. What should I watch for while using this medicine? Visit your doctor or health care professional for regular checks on your progress. It may be several weeks before you see the full effects of this medicine. Notify your doctor or health care professional if your symptoms get worse, if you have new symptoms, if you are having an unusual effect from this medicine, or if you feel out of control, very discouraged or think you might harm yourself or others. Do not suddenly stop taking this medicine. You may need to gradually reduce the dose. Ask your doctor or health care professional for advice. You may  get dizzy or drowsy. Do not drive, use machinery, or do anything that  needs mental alertness until you know how this medicine affects you. Do not stand or sit up quickly, especially if you are an older patient. This reduces the risk of dizzy or fainting spells. Avoid alcoholic drinks. Alcohol can increase dizziness and drowsiness with olanzapine. Do not treat yourself for colds, diarrhea or allergies without asking your doctor or health care professional for advice. Some ingredients can increase possible side effects. Your mouth may get dry. Chewing sugarless gum or sucking hard candy, and drinking plenty of water will help. This medicine can reduce the response of your body to heat or cold. Dress warm in cold weather and stay hydrated in hot weather. If possible, avoid extreme temperatures like saunas, hot tubs, very hot or cold showers, or activities that can cause dehydration such as vigorous exercise. If you notice an increased hunger or thirst, different from your normal hunger or thirst, or if you find that you have to urinate more frequently, you should contact your health care provider as soon as possible. You may need to have your blood sugar monitored. This medicine may cause changes in your blood sugar levels. You should monitor you blood sugar frequently if you have diabetes. If you smoke, tell your doctor if you notice this medicine is not working well for you. Talk to your doctor if you are a smoker or if you decide to stop smoking. What side effects may I notice from receiving this medicine? Side effects that you should report to your doctor or health care professional as soon as possible: -allergic reactions like skin rash, itching or hives, swelling of the face, lips, or tongue -breathing problems -difficulty in speaking or swallowing -excessive thirst and/or hunger -fast heartbeat (palpitations) -fever or chills, sore throat -fever with rash, swollen lymph nodes, or swelling of the face -frequently needing to urinate -inability to control muscle  movements in the face, hands, arms, or legs -painful or prolonged erections -redness, blistering, peeling or loosening of the skin, including inside the mouth -restlessness or need to keep moving -seizures (convulsions) -stiffness, spasms -tremors or trembling Side effects that usually do not require medical attention (report to your doctor or health care professional if they continue or are bothersome): -changes in sexual desire -constipation -drowsiness -lowered blood pressure This list may not describe all possible side effects. Call your doctor for medical advice about side effects. You may report side effects to FDA at 1-800-FDA-1088. Where should I keep my medicine? Keep out of the reach of children. Store at controlled room temperature between 15 and 30 degrees C (59 and 86 degrees F). Protect from light and moisture. Throw away any unused medicine after the expiration date. NOTE: This sheet is a summary. It may not cover all possible information. If you have questions about this medicine, talk to your doctor, pharmacist, or health care provider.    2016, Elsevier/Gold Standard. (2014-09-06 17:33:14)

## 2015-11-10 DIAGNOSIS — Z0289 Encounter for other administrative examinations: Secondary | ICD-10-CM

## 2015-11-16 NOTE — Telephone Encounter (Signed)
Completed, reviewed and signed.  To MR.

## 2015-11-29 ENCOUNTER — Telehealth: Payer: Self-pay | Admitting: *Deleted

## 2015-11-29 NOTE — Telephone Encounter (Signed)
Form faxed to [facility]. [phone]

## 2015-12-07 ENCOUNTER — Other Ambulatory Visit: Payer: Self-pay | Admitting: *Deleted

## 2015-12-07 DIAGNOSIS — G308 Other Alzheimer's disease: Principal | ICD-10-CM

## 2015-12-07 DIAGNOSIS — F0281 Dementia in other diseases classified elsewhere with behavioral disturbance: Secondary | ICD-10-CM

## 2015-12-07 MED ORDER — OLANZAPINE 7.5 MG PO TABS: 7.5000 mg | ORAL_TABLET | Freq: Every day | ORAL | 1 refills | Status: DC

## 2015-12-07 NOTE — Telephone Encounter (Signed)
Request for 90 day  supply

## 2016-01-02 ENCOUNTER — Other Ambulatory Visit: Payer: Self-pay | Admitting: Neurology

## 2016-01-29 ENCOUNTER — Observation Stay (HOSPITAL_COMMUNITY): Payer: Medicare Other

## 2016-01-29 ENCOUNTER — Encounter (HOSPITAL_COMMUNITY): Payer: Self-pay | Admitting: Emergency Medicine

## 2016-01-29 ENCOUNTER — Emergency Department (HOSPITAL_COMMUNITY): Payer: Medicare Other

## 2016-01-29 ENCOUNTER — Observation Stay (HOSPITAL_COMMUNITY)
Admission: EM | Admit: 2016-01-29 | Discharge: 2016-01-30 | Disposition: A | Discharge status: Home / Self Care | Payer: Medicare Other | Attending: Internal Medicine | Admitting: Internal Medicine

## 2016-01-29 DIAGNOSIS — Z7982 Long term (current) use of aspirin: Secondary | ICD-10-CM | POA: Insufficient documentation

## 2016-01-29 DIAGNOSIS — G459 Transient cerebral ischemic attack, unspecified: Secondary | ICD-10-CM | POA: Diagnosis not present

## 2016-01-29 DIAGNOSIS — F0280 Dementia in other diseases classified elsewhere without behavioral disturbance: Secondary | ICD-10-CM

## 2016-01-29 DIAGNOSIS — F0390 Unspecified dementia without behavioral disturbance: Secondary | ICD-10-CM

## 2016-01-29 DIAGNOSIS — F03C0 Unspecified dementia, severe, without behavioral disturbance, psychotic disturbance, mood disturbance, and anxiety: Secondary | ICD-10-CM | POA: Diagnosis present

## 2016-01-29 DIAGNOSIS — R262 Difficulty in walking, not elsewhere classified: Secondary | ICD-10-CM | POA: Diagnosis not present

## 2016-01-29 DIAGNOSIS — G458 Other transient cerebral ischemic attacks and related syndromes: Secondary | ICD-10-CM | POA: Diagnosis not present

## 2016-01-29 DIAGNOSIS — I10 Essential (primary) hypertension: Secondary | ICD-10-CM | POA: Insufficient documentation

## 2016-01-29 DIAGNOSIS — G309 Alzheimer's disease, unspecified: Secondary | ICD-10-CM

## 2016-01-29 DIAGNOSIS — E039 Hypothyroidism, unspecified: Secondary | ICD-10-CM | POA: Diagnosis not present

## 2016-01-29 DIAGNOSIS — E785 Hyperlipidemia, unspecified: Secondary | ICD-10-CM | POA: Diagnosis present

## 2016-01-29 DIAGNOSIS — R531 Weakness: Secondary | ICD-10-CM | POA: Diagnosis present

## 2016-01-29 DIAGNOSIS — G8929 Other chronic pain: Secondary | ICD-10-CM

## 2016-01-29 DIAGNOSIS — M545 Low back pain: Secondary | ICD-10-CM

## 2016-01-29 DIAGNOSIS — Z79899 Other long term (current) drug therapy: Secondary | ICD-10-CM | POA: Diagnosis not present

## 2016-01-29 DIAGNOSIS — Z87891 Personal history of nicotine dependence: Secondary | ICD-10-CM | POA: Insufficient documentation

## 2016-01-29 LAB — COMPREHENSIVE METABOLIC PANEL
ALT: 16 U/L (ref 14–54)
ANION GAP: 4 — AB (ref 5–15)
AST: 25 U/L (ref 15–41)
Albumin: 4 g/dL (ref 3.5–5.0)
Alkaline Phosphatase: 80 U/L (ref 38–126)
BUN: 21 mg/dL — ABNORMAL HIGH (ref 6–20)
CALCIUM: 9.6 mg/dL (ref 8.9–10.3)
CHLORIDE: 108 mmol/L (ref 101–111)
CO2: 29 mmol/L (ref 22–32)
Creatinine, Ser: 1.13 mg/dL — ABNORMAL HIGH (ref 0.44–1.00)
GFR, EST AFRICAN AMERICAN: 51 mL/min — AB (ref >60)
GFR, EST NON AFRICAN AMERICAN: 44 mL/min — AB (ref >60)
Glucose, Bld: 77 mg/dL (ref 65–99)
Potassium: 4 mmol/L (ref 3.5–5.1)
SODIUM: 141 mmol/L (ref 135–145)
Total Bilirubin: 0.5 mg/dL (ref 0.3–1.2)
Total Protein: 7.4 g/dL (ref 6.5–8.1)

## 2016-01-29 LAB — CBC
HCT: 38.6 % (ref 36.0–46.0)
Hemoglobin: 12.6 g/dL (ref 12.0–15.0)
MCH: 30 pg (ref 26.0–34.0)
MCHC: 32.6 g/dL (ref 30.0–36.0)
MCV: 91.9 fL (ref 78.0–100.0)
PLATELETS: 230 10*3/uL (ref 150–400)
RBC: 4.2 MIL/uL (ref 3.87–5.11)
RDW: 13.3 % (ref 11.5–15.5)
WBC: 7.5 10*3/uL (ref 4.0–10.5)

## 2016-01-29 LAB — URINALYSIS, ROUTINE W REFLEX MICROSCOPIC
BILIRUBIN URINE: NEGATIVE
Glucose, UA: NEGATIVE mg/dL
Hgb urine dipstick: NEGATIVE
KETONES UR: NEGATIVE mg/dL
NITRITE: NEGATIVE
PH: 5.5 (ref 5.0–8.0)
PROTEIN: NEGATIVE mg/dL

## 2016-01-29 LAB — I-STAT CHEM 8, ED
BUN: 24 mg/dL — ABNORMAL HIGH (ref 6–20)
CALCIUM ION: 1.17 mmol/L (ref 1.15–1.40)
CHLORIDE: 106 mmol/L (ref 101–111)
Creatinine, Ser: 1.1 mg/dL — ABNORMAL HIGH (ref 0.44–1.00)
Glucose, Bld: 113 mg/dL — ABNORMAL HIGH (ref 65–99)
HCT: 38 % (ref 36.0–46.0)
HEMOGLOBIN: 12.9 g/dL (ref 12.0–15.0)
Potassium: 4.3 mmol/L (ref 3.5–5.1)
Sodium: 143 mmol/L (ref 135–145)
TCO2: 27 mmol/L (ref 0–100)

## 2016-01-29 LAB — DIFFERENTIAL
BASOS PCT: 1 %
Basophils Absolute: 0.1 10*3/uL (ref 0.0–0.1)
EOS PCT: 3 %
Eosinophils Absolute: 0.2 10*3/uL (ref 0.0–0.7)
Lymphocytes Relative: 21 %
Lymphs Abs: 1.6 10*3/uL (ref 0.7–4.0)
MONO ABS: 1 10*3/uL (ref 0.1–1.0)
Monocytes Relative: 14 %
Neutro Abs: 4.6 10*3/uL (ref 1.7–7.7)
Neutrophils Relative %: 61 %

## 2016-01-29 LAB — APTT: aPTT: 28 seconds (ref 24–36)

## 2016-01-29 LAB — URINE MICROSCOPIC-ADD ON

## 2016-01-29 LAB — RAPID URINE DRUG SCREEN, HOSP PERFORMED
AMPHETAMINES: NOT DETECTED
BENZODIAZEPINES: POSITIVE — AB
Barbiturates: NOT DETECTED
Cocaine: NOT DETECTED
OPIATES: POSITIVE — AB
Tetrahydrocannabinol: NOT DETECTED

## 2016-01-29 LAB — I-STAT TROPONIN, ED: TROPONIN I, POC: 0.01 ng/mL (ref 0.00–0.08)

## 2016-01-29 LAB — PROTIME-INR
INR: 1.05
PROTHROMBIN TIME: 13.7 s (ref 11.4–15.2)

## 2016-01-29 LAB — ETHANOL

## 2016-01-29 MED ORDER — CALCIUM CARBONATE 1250 (500 CA) MG PO TABS
1250.0000 mg | ORAL_TABLET | Freq: Two times a day (BID) | ORAL | Status: DC
  Administered 2016-01-29 – 2016-01-30 (×2): 1250 mg via ORAL
  Filled 2016-01-29 (×2): qty 1

## 2016-01-29 MED ORDER — DARIFENACIN HYDROBROMIDE ER 7.5 MG PO TB24
7.5000 mg | ORAL_TABLET | Freq: Every day | ORAL | Status: DC
  Administered 2016-01-30: 7.5 mg via ORAL
  Filled 2016-01-29: qty 1

## 2016-01-29 MED ORDER — ASPIRIN EC 81 MG PO TBEC
81.0000 mg | DELAYED_RELEASE_TABLET | Freq: Every day | ORAL | Status: DC
  Administered 2016-01-29 – 2016-01-30 (×2): 81 mg via ORAL
  Filled 2016-01-29 (×2): qty 1

## 2016-01-29 MED ORDER — GLUCOSAMINE-CHONDROITIN 500-400 MG PO TABS: 1.0000 | ORAL_TABLET | Freq: Two times a day (BID) | ORAL | Status: DC

## 2016-01-29 MED ORDER — OLANZAPINE 5 MG PO TABS
7.5000 mg | ORAL_TABLET | Freq: Every day | ORAL | Status: DC
  Administered 2016-01-29: 7.5 mg via ORAL
  Filled 2016-01-29: qty 2

## 2016-01-29 MED ORDER — PANTOPRAZOLE SODIUM 40 MG PO TBEC
40.0000 mg | DELAYED_RELEASE_TABLET | Freq: Every day | ORAL | Status: DC
  Administered 2016-01-30: 40 mg via ORAL
  Filled 2016-01-29: qty 1

## 2016-01-29 MED ORDER — LEVOTHYROXINE SODIUM 50 MCG PO TABS
100.0000 ug | ORAL_TABLET | Freq: Every day | ORAL | Status: DC
Start: 2016-01-29 — End: 2016-01-30
  Administered 2016-01-29 – 2016-01-30 (×2): 100 ug via ORAL
  Filled 2016-01-29 (×2): qty 2

## 2016-01-29 MED ORDER — SODIUM CHLORIDE 0.9 % IV SOLN
INTRAVENOUS | Status: DC
  Administered 2016-01-29: 19:00:00 via INTRAVENOUS

## 2016-01-29 MED ORDER — STROKE: EARLY STAGES OF RECOVERY BOOK
Freq: Once | Status: DC
  Filled 2016-01-29: qty 1

## 2016-01-29 MED ORDER — SENNOSIDES-DOCUSATE SODIUM 8.6-50 MG PO TABS: 1.0000 | ORAL_TABLET | Freq: Every evening | ORAL | Status: DC | PRN

## 2016-01-29 MED ORDER — HEPARIN SODIUM (PORCINE) 5000 UNIT/ML IJ SOLN
5000.0000 [IU] | Freq: Three times a day (TID) | INTRAMUSCULAR | Status: DC
  Administered 2016-01-29 – 2016-01-30 (×2): 5000 [IU] via SUBCUTANEOUS
  Filled 2016-01-29 (×2): qty 1

## 2016-01-29 MED ORDER — CLOPIDOGREL BISULFATE 75 MG PO TABS
75.0000 mg | ORAL_TABLET | Freq: Every day | ORAL | Status: DC
  Administered 2016-01-29 – 2016-01-30 (×2): 75 mg via ORAL
  Filled 2016-01-29 (×2): qty 1

## 2016-01-29 NOTE — Progress Notes (Signed)
Received report from [place] in ED regarding patient coming to [facility]. Oswald Hillock, RN

## 2016-01-29 NOTE — ED Notes (Signed)
Hospitalist at bedside 

## 2016-01-29 NOTE — ED Triage Notes (Addendum)
Per EMS Family noticed at 12:10 she couldn't walk.  EMS reports L side facial droop, L arm and leg weakness. At 12:35 EMS noticed the deficit going away. Vitals: 127/58, 65 HR, 100%. CBG-141. Pt from home.

## 2016-01-29 NOTE — ED Notes (Signed)
Patient transported to CT 

## 2016-01-29 NOTE — Progress Notes (Signed)
PHARMACIST - PHYSICIAN ORDER COMMUNICATION  CONCERNING: P&T Medication Policy on Herbal Medications  DESCRIPTION:  This patient's order for:  Glucosamine and chondroitin  has been noted.  This product(s) is classified as an "herbal" or natural product. Due to a lack of definitive safety studies or FDA approval, nonstandard manufacturing practices, plus the potential risk of unknown drug-drug interactions while on inpatient medications, the Pharmacy and Therapeutics Committee does not permit the use of "herbal" or natural products of this type within [facility].   ACTION TAKEN: The pharmacy department is unable to verify this order at this time and your patient has been informed of this safety policy. Please reevaluate patient's clinical condition at discharge and address if the herbal or natural product(s) should be resumed at that time.

## 2016-01-29 NOTE — ED Notes (Signed)
Lab at bedside

## 2016-01-29 NOTE — H&P (Signed)
History and Physical    Suzanne Fritz:[id number] DOB: 09-01-1931 DOA: 01/29/2016  PCP: Ardelle Anton, MD  Patient coming from: Home.    Chief Complaint:  Left facial droop and imbalance.   HPI: Suzanne Fritz is an 82 y.o. female with hx of advanced dementia, DNR Code status, HTN, HLD, lives at home with her husband and her daughter, brought to the ER with left facial droop and ataxia with weakness.  Her ictus was within 3 hours, however, she was not a candidate for TPA as her symptoms resolved upon arrival to the ER.  She is quite confused, unable to give any reliable hx.  Family states that she has been compliant with taking her ASA, and she reportedly had no prior similar symptoms.  In the ER, her VSS, and her head CT was negative.  EKG showed NSR, and serology was unremarkable.  Hospitalist was asked to admit her for TIA/CVA work up.  ED Course:  See above.  Rewiew of Systems: Unable due to advanced dementia.   Past Medical History:  Diagnosis Date  . Adenocarcinoma ([place])   . Advanced dementia 05/30/2014  . Alzheimer disease   . Anxiety    STRESS AND MENOPAUSAL SYMPTOMS  . Arthritis   . Back pain   . Behavior disorder    REM  . Clinical depression 05/30/2014  . Dementia   . Depression   . GERD (gastroesophageal reflux disease)   . Hearing difficulty    DECREASED HEARING  . Hearing loss   . Hypercholesterolemia   . Hyperlipidemia   . Hypertension   . Hypothyroidism   . Lung cancer ([place]) 04/2010   upper lobe removed   . Memory loss   . Memory problem   . Migraine   . Osteoarthritis    RIGHT KNEE  . Peripheral neuropathy ([place])   . Pneumonia 03/2014  . Syncope and collapse 02/2010   HOSPITALIZED    Rewiew of Systems:  Constitutional: Negative for malaise, fever and chills. No significant weight loss or weight gain Eyes: Negative for eye pain, redness and discharge, diplopia, visual changes, or flashes of light. ENMT: Negative for ear pain, hoarseness, nasal  congestion, sinus pressure and sore throat. No headaches; tinnitus, drooling, or problem swallowing. Cardiovascular: Negative for chest pain, palpitations, diaphoresis, dyspnea and peripheral edema. ; No orthopnea, PND Respiratory: Negative for cough, hemoptysis, wheezing and stridor. No pleuritic chestpain. Gastrointestinal: Negative for nausea, vomiting, diarrhea, constipation, abdominal pain, melena, blood in stool, hematemesis, jaundice and rectal bleeding.    Genitourinary: Negative for frequency, dysuria, incontinence,flank pain and hematuria; Musculoskeletal: Negative for back pain and neck pain. Negative for swelling and trauma.;  Skin: . Negative for pruritus, rash, abrasions, bruising and skin lesion.; ulcerations Neuro: Negative for headache, lightheadedness and neck stiffness. Negative for weakness, altered level of consciousness , altered mental status, extremity weakness, burning feet, involuntary movement, seizure and syncope.  Psych: negative for anxiety, depression, insomnia, tearfulness, panic attacks, hallucinations, paranoia, suicidal or homicidal ideation   Past Surgical History:  Procedure Laterality Date  . APPENDECTOMY    . BACK SURGERY    . CHOLECYSTECTOMY    . HEMORRHOID SURGERY    . KNEE SURGERY     RIGHT SIDE  . Left upper lobe resection  04/2010  . THYROIDECTOMY    . VAGINAL HYSTERECTOMY       reports that she quit smoking about 41 years ago. She has never used smokeless tobacco. She reports that she does not drink alcohol  or use drugs.  Allergies  Allergen Reactions  . Codeine     Family History  Problem Relation Age of Onset  . Stroke Mother 19  . Cancer Father 69    Stomach  . Coronary artery disease Brother     2 brothers , but alive  . Migraines Child      Prior to Admission medications   Medication Sig Start Date End Date Taking? Authorizing Provider  aspirin 81 MG EC tablet Take 81 mg by mouth daily.     Yes Historical Provider, MD    Calcium Carbonate (CALCIUM 600 PO) Take 1 tablet by mouth 2 (two) times daily. 1 po daily    Yes Historical Provider, MD  Cholecalciferol (VITAMIN D) 1000 UNITS capsule Take 5,000 Units by mouth daily.    Yes Historical Provider, MD  glucosamine-chondroitin 500-400 MG tablet Take 1 tablet by mouth 2 (two) times daily.    Yes Historical Provider, MD  HYDROcodone-acetaminophen (NORCO/VICODIN) 5-325 MG per tablet Take 1 tablet by mouth every 8 (eight) hours as needed for moderate pain.    Yes Historical Provider, MD  levothyroxine (SYNTHROID, LEVOTHROID) 100 MCG tablet Take 100 mcg by mouth.    Yes Historical Provider, MD  NAMZARIC 28-10 MG CP24 TAKE 1 CAPSULE BEFORE BREAKFAST Patient taking differently: Take 1 capsule at supper 01/02/16  Yes Carmen Dohmeier, MD  OLANZapine (ZYPREXA) 7.5 MG tablet Take 1 tablet (7.5 mg total) by mouth at bedtime. 12/07/15  Yes Carmen Dohmeier, MD  omeprazole (PRILOSEC) 20 MG capsule Take 20 mg by mouth 2 (two) times daily.     Yes Historical Provider, MD  VESICARE 10 MG tablet TAKE ONE TABLET (10 MG TOTAL) BY MOUTH DAILY. 05/26/15  Yes Historical Provider, MD  promethazine (PHENERGAN) 25 MG tablet Take 25 mg by mouth every 6 (six) hours as needed.      Historical Provider, MD    Physical Exam: Vitals:   01/29/16 1500 01/29/16 1530 01/29/16 1600 01/29/16 1603  BP: 156/56 152/64 155/78   Pulse: 62 (!) 59 63   Resp: '13 12 13   '$ Temp:    97.9 F (36.6 C)  TempSrc:      SpO2: 97% 97% 98%   Weight:      Height:          Constitutional: NAD, calm, comfortable Vitals:   01/29/16 1500 01/29/16 1530 01/29/16 1600 01/29/16 1603  BP: 156/56 152/64 155/78   Pulse: 62 (!) 59 63   Resp: '13 12 13   '$ Temp:    97.9 F (36.6 C)  TempSrc:      SpO2: 97% 97% 98%   Weight:      Height:       Eyes: PERRL, lids and conjunctivae normal ENMT: Mucous membranes are moist. Posterior pharynx clear of any exudate or lesions.Normal dentition.  Neck: normal, supple, no masses,  no thyromegaly Respiratory: clear to auscultation bilaterally, no wheezing, no crackles. Normal respiratory effort. No accessory muscle use.  Cardiovascular: Regular rate and rhythm, no murmurs / rubs / gallops. No extremity edema. 2+ pedal pulses. No carotid bruits.  Abdomen: no tenderness, no masses palpated. No hepatosplenomegaly. Bowel sounds positive.  Musculoskeletal: no clubbing / cyanosis. No joint deformity upper and lower extremities. Good ROM, no contractures. Normal muscle tone.  Skin: no rashes, lesions, ulcers. No induration Neurologic: CN 2-12 grossly intact. Sensation intact, DTR normal. Strength 5/5 in all 4.  Psychiatric: Normal judgment and insight. Alert and oriented x 3. Normal mood.  Labs on Admission: I have personally reviewed following labs and imaging studies  CBC:  Recent Labs Lab 01/29/16 1326 01/29/16 1333  WBC  --  7.5  NEUTROABS  --  4.6  HGB 12.9 12.6  HCT 38.0 38.6  MCV  --  91.9  PLT  --  161   Basic Metabolic Panel:  Recent Labs Lab 01/29/16 1326 01/29/16 1333  NA 143 141  K 4.3 4.0  CL 106 108  CO2  --  29  GLUCOSE 113* 77  BUN 24* 21*  CREATININE 1.10* 1.13*  CALCIUM  --  9.6   GFR: Estimated Creatinine Clearance: 40.8 mL/min (by C-G formula based on SCr of 1.13 mg/dL (H)). Liver Function Tests:  Recent Labs Lab 01/29/16 1333  AST 25  ALT 16  ALKPHOS 80  BILITOT 0.5  PROT 7.4  ALBUMIN 4.0   Coagulation Profile:  Recent Labs Lab 01/29/16 1333  INR 1.05   Urine analysis:    Component Value Date/Time   COLORURINE YELLOW 01/29/2016 1306   APPEARANCEUR CLOUDY (A) 01/29/2016 1306   LABSPEC >1.030 (H) 01/29/2016 1306   PHURINE 5.5 01/29/2016 1306   GLUCOSEU NEGATIVE 01/29/2016 1306   HGBUR NEGATIVE 01/29/2016 1306   BILIRUBINUR NEGATIVE 01/29/2016 1306   BILIRUBINUR negative 05/13/2013 1718   KETONESUR NEGATIVE 01/29/2016 1306   PROTEINUR NEGATIVE 01/29/2016 1306   UROBILINOGEN negative 05/13/2013 1718    UROBILINOGEN 0.2 09/25/2010 1410   NITRITE NEGATIVE 01/29/2016 1306   LEUKOCYTESUR MODERATE (A) 01/29/2016 1306    Radiological Exams on Admission: Ct Head Wo Contrast  Result Date: 01/29/2016 CLINICAL DATA:  Left-sided weakness and facial droop. EXAM: CT HEAD WITHOUT CONTRAST TECHNIQUE: Contiguous axial images were obtained from the base of the skull through the vertex without intravenous contrast. COMPARISON:  05/10/2010 FINDINGS: Brain: Slightly progressive cerebral atrophy, ventriculomegaly and periventricular white matter disease. No extra-axial fluid collections are identified. No CT findings for acute hemispheric infarction or intracranial hemorrhage. No mass lesions. The brainstem and cerebellum are normal. Vascular: Stable vascular calcifications. No definite aneurysm or hyperdense vessels. Skull: No skull fracture or bone lesion. Mild hyperostosis frontalis interna. Sinuses/Orbits: The paranasal sinuses and mastoid air cells are clear. The globes are intact. Other: No scalp lesions or hematoma. IMPRESSION: Slightly progressive age related cerebral atrophy, ventriculomegaly and periventricular white matter disease. No acute intracranial findings or mass lesion. Electronically Signed   By: Marijo Sanes M.D.   On: 01/29/2016 14:19    EKG: Independently reviewed.  Assessment/Plan Principal Problem:   TIA (transient ischemic attack) Active Problems:   HTN (hypertension)   Hyperlipidemia   Dementia in Alzheimer's disease   Advanced dementia   PLAN:   TIA/CVA:  I suspect she had a TIA.  Will admit her for stroke work up, to include MRI/ECHO/Carotids US.  Since she has been compliant with ASA, will add Plavix and provide DUAT.  Will obtain swallow test prior to giving diet and oral meds.   HLD:  Will continue with statin.  Check lipid.  HTN:  Will permit SBP to 096.  Avoid hypotension.  Dementia:  Advanced.  Continue meds.   DVT prophylaxis: heparin SQ.  Code Status: DNR.  This was  confirmed with her daughter who is the POA/HCP.  Family Communication: granddaughter, husband, and later daughter.  Disposition Plan:  To home when appropriate.  Consults called: None. Admission status: OBS.    Skylyn Slezak MD FACP. [facility]  If 7PM-7AM, please contact night-coverage [email or web address] Password [email or web address]  01/29/2016, 4:30  PM  

## 2016-01-29 NOTE — Progress Notes (Signed)
Patient arrived to unit. VSS. Patient is in no distress/pain. Order/chart reviewed. RN will continue to monitor. Oswald Hillock, RN

## 2016-01-29 NOTE — ED Provider Notes (Signed)
[facility] DEPT Provider Note   CSN: [id number] Arrival date & time: 01/29/16  1250     History   Chief Complaint Chief Complaint  Patient presents with  . Weakness    HPI Suzanne Fritz is a 82 y.o. female.  The patient is an 82 year old female, she does have a history of hypertension, hyperlipidemia, dementia and hypothyroidism status post partial pneumonectomy secondary to lung cancer. She presents to the hospital from home where she was sitting on her back porch when the family members noticed that she was having some difficulty walking, they placed her in the chair, they noticed that she was having weakness, the paramedics noted that she had left-sided facial droop associated with left leg and left arm weakness and essentially no grip in the left hand. Her facial droop was profound, in the am awaiting the hospital she had complete resolution of all of her symptoms back to normal. her speech was normal, her facial droop resolved and her weakness has resolved as well. the symptoms were acute in onset sometime between 12:00 and 12:15 pm today, they persisted for less than 35 minutes and resolved on the way to the hospital. The patient has no complaints.    Weakness     Past Medical History:  Diagnosis Date  . Adenocarcinoma ([place])   . Advanced dementia 05/30/2014  . Alzheimer disease   . Anxiety    STRESS AND MENOPAUSAL SYMPTOMS  . Arthritis   . Back pain   . Behavior disorder    REM  . Clinical depression 05/30/2014  . Dementia   . Depression   . GERD (gastroesophageal reflux disease)   . Hearing difficulty    DECREASED HEARING  . Hearing loss   . Hypercholesterolemia   . Hyperlipidemia   . Hypertension   . Hypothyroidism   . Lung cancer ([place]) 04/2010   upper lobe removed   . Memory loss   . Memory problem   . Migraine   . Osteoarthritis    RIGHT KNEE  . Peripheral neuropathy ([place])   . Pneumonia 03/2014  . Syncope and collapse 02/2010   HOSPITALIZED     Patient Active Problem List   Diagnosis Date Noted  . Alzheimer's disease 09/12/2014  . Advanced dementia 05/30/2014  . Head revolving around 05/30/2014  . Clinical depression 05/30/2014  . Anemia, iron deficiency 02/14/2014  . Dementia in Alzheimer's disease 09/16/2012  . Bronchogenic lung cancer ([place]) 09/16/2012  . Preop cardiovascular exam 09/12/2010  . HTN (hypertension) 09/12/2010  . Hyperlipidemia 09/12/2010    Past Surgical History:  Procedure Laterality Date  . APPENDECTOMY    . BACK SURGERY    . CHOLECYSTECTOMY    . HEMORRHOID SURGERY    . KNEE SURGERY     RIGHT SIDE  . Left upper lobe resection  04/2010  . THYROIDECTOMY    . VAGINAL HYSTERECTOMY      OB History    No data available       Home Medications    Prior to Admission medications   Medication Sig Start Date End Date Taking? Authorizing Provider  aspirin 81 MG EC tablet Take 81 mg by mouth daily.     Yes Historical Provider, MD  Calcium Carbonate (CALCIUM 600 PO) Take 1 tablet by mouth 2 (two) times daily. 1 po daily    Yes Historical Provider, MD  Cholecalciferol (VITAMIN D) 1000 UNITS capsule Take 5,000 Units by mouth daily.    Yes Historical Provider, MD  glucosamine-chondroitin  500-400 MG tablet Take 1 tablet by mouth 2 (two) times daily.    Yes Historical Provider, MD  HYDROcodone-acetaminophen (NORCO/VICODIN) 5-325 MG per tablet Take 1 tablet by mouth every 8 (eight) hours as needed for moderate pain.    Yes Historical Provider, MD  levothyroxine (SYNTHROID, LEVOTHROID) 100 MCG tablet Take 100 mcg by mouth.    Yes Historical Provider, MD  NAMZARIC 28-10 MG CP24 TAKE 1 CAPSULE BEFORE BREAKFAST Patient taking differently: Take 1 capsule at supper 01/02/16  Yes Carmen Dohmeier, MD  OLANZapine (ZYPREXA) 7.5 MG tablet Take 1 tablet (7.5 mg total) by mouth at bedtime. 12/07/15  Yes Carmen Dohmeier, MD  omeprazole (PRILOSEC) 20 MG capsule Take 20 mg by mouth 2 (two) times daily.     Yes Historical  Provider, MD  VESICARE 10 MG tablet TAKE ONE TABLET (10 MG TOTAL) BY MOUTH DAILY. 05/26/15  Yes Historical Provider, MD  promethazine (PHENERGAN) 25 MG tablet Take 25 mg by mouth every 6 (six) hours as needed.      Historical Provider, MD    Family History Family History  Problem Relation Age of Onset  . Stroke Mother 36  . Cancer Father 26    Stomach  . Coronary artery disease Brother     2 brothers , but alive  . Migraines Child     Social History Social History  Substance Use Topics  . Smoking status: Former Smoker    Quit date: 04/29/1974  . Smokeless tobacco: Never Used  . Alcohol use No     Allergies   Codeine   Review of Systems Review of Systems  Unable to perform ROS: Dementia  Neurological: Positive for weakness.     Physical Exam Updated Vital Signs BP 136/59   Pulse (!) 53   Temp 98.4 F (36.9 C) (Oral)   Resp 15   Ht '5\' 6"'$  (1.676 m)   Wt 175 lb (79.4 kg)   SpO2 96%   BMI 28.25 kg/m   Physical Exam  Constitutional: She appears well-developed and well-nourished. No distress.  HENT:  Head: Normocephalic and atraumatic.  Mouth/Throat: Oropharynx is clear and moist. No oropharyngeal exudate.  Eyes: Conjunctivae and EOM are normal. Pupils are equal, round, and reactive to light. Right eye exhibits no discharge. Left eye exhibits no discharge. No scleral icterus.  Neck: Normal range of motion. Neck supple. No JVD present. No thyromegaly present.  Cardiovascular: Normal rate, regular rhythm, normal heart sounds and intact distal pulses.  Exam reveals no gallop and no friction rub.   No murmur heard. Pulmonary/Chest: Effort normal and breath sounds normal. No respiratory distress. She has no wheezes. She has no rales.  Abdominal: Soft. Bowel sounds are normal. She exhibits no distension and no mass. There is no tenderness.  Musculoskeletal: Normal range of motion. She exhibits no edema or tenderness.  Lymphadenopathy:    She has no cervical adenopathy.   Neurological: She is alert. Coordination normal.  Normal strength and sensation in all 4 extremities, normal grips bilaterally, no facial droop, cranial nerves III through XII are normal, normal finger nose finger, follows commands without difficulty, speech is clear and unlabored  Skin: Skin is warm and dry. No rash noted. No erythema.  Psychiatric: She has a normal mood and affect. Her behavior is normal.  Nursing note and vitals reviewed.    ED Treatments / Results  Labs (all labs ordered are listed, but only abnormal results are displayed) Labs Reviewed  COMPREHENSIVE METABOLIC PANEL - Abnormal; Notable  for the following:       Result Value   BUN 21 (*)    Creatinine, Ser 1.13 (*)    GFR calc non Af Amer 44 (*)    GFR calc Af Amer 51 (*)    Anion gap 4 (*)    All other components within normal limits  URINE RAPID DRUG SCREEN, HOSP PERFORMED - Abnormal; Notable for the following:    Opiates POSITIVE (*)    Benzodiazepines POSITIVE (*)    All other components within normal limits  URINALYSIS, ROUTINE W REFLEX MICROSCOPIC (NOT AT [facility]) - Abnormal; Notable for the following:    APPearance CLOUDY (*)    Specific Gravity, Urine >1.030 (*)    Leukocytes, UA MODERATE (*)    All other components within normal limits  URINE MICROSCOPIC-ADD ON - Abnormal; Notable for the following:    Squamous Epithelial / LPF 0-5 (*)    Bacteria, UA MANY (*)    Crystals CA OXALATE CRYSTALS (*)    All other components within normal limits  I-STAT CHEM 8, ED - Abnormal; Notable for the following:    BUN 24 (*)    Creatinine, Ser 1.10 (*)    Glucose, Bld 113 (*)    All other components within normal limits  ETHANOL  PROTIME-INR  APTT  CBC  DIFFERENTIAL  I-STAT TROPOININ, ED    EKG  EKG Interpretation  Date/Time:  Monday January 29 2016 12:57:34 EDT Ventricular Rate:  66 PR Interval:    QRS Duration: 87 QT Interval:  422 QTC Calculation: 443 R Axis:   33 Text Interpretation:  Sinus  rhythm Nonspecific T abnormalities, lateral leads since last tracing no significant change Confirmed by Sabra Heck  MD, Donisha Hoch ([id number]) on 01/29/2016 2:15:38 PM       Radiology Ct Head Wo Contrast  Result Date: 01/29/2016 CLINICAL DATA:  Left-sided weakness and facial droop. EXAM: CT HEAD WITHOUT CONTRAST TECHNIQUE: Contiguous axial images were obtained from the base of the skull through the vertex without intravenous contrast. COMPARISON:  05/10/2010 FINDINGS: Brain: Slightly progressive cerebral atrophy, ventriculomegaly and periventricular white matter disease. No extra-axial fluid collections are identified. No CT findings for acute hemispheric infarction or intracranial hemorrhage. No mass lesions. The brainstem and cerebellum are normal. Vascular: Stable vascular calcifications. No definite aneurysm or hyperdense vessels. Skull: No skull fracture or bone lesion. Mild hyperostosis frontalis interna. Sinuses/Orbits: The paranasal sinuses and mastoid air cells are clear. The globes are intact. Other: No scalp lesions or hematoma. IMPRESSION: Slightly progressive age related cerebral atrophy, ventriculomegaly and periventricular white matter disease. No acute intracranial findings or mass lesion. Electronically Signed   By: Marijo Sanes M.D.   On: 01/29/2016 14:19    Procedures Procedures (including critical care time)  Medications Ordered in ED Medications - No data to display   Initial Impression / Assessment and Plan / ED Course  I have reviewed the triage vital signs and the nursing notes.  Pertinent labs & imaging results that were available during my care of the patient were reviewed by me and considered in my medical decision making (see chart for details).  Clinical Course   There is no signs of atrial fibrillation, the patient does not have any signs of a stroke however she very clearly had a transient ischemic attack based on symptoms prehospital. Labs and CT scan ordered, she will  likely need admission to the hospital for further evaluation and rule out of a source of potential stroke.   CT  neg Labs reviewed without explanatory etiology of weaknes No recurrent sx while here D/w Dr. Marin Comment - will admit  Final Clinical Impressions(s) / ED Diagnoses   Final diagnoses:  Transient cerebral ischemia, unspecified type    New Prescriptions New Prescriptions   No medications on file     Noemi Chapel, MD 01/29/16 1453

## 2016-01-29 NOTE — Progress Notes (Signed)
Pt was listed as a FULL code on chart. Noted in MD's notes that disposition was DNR. MD notified and paged. MD gave verbal order to change to DNR. Oswald Hillock, RN

## 2016-01-30 ENCOUNTER — Observation Stay (HOSPITAL_COMMUNITY): Payer: Medicare Other

## 2016-01-30 DIAGNOSIS — G459 Transient cerebral ischemic attack, unspecified: Secondary | ICD-10-CM | POA: Diagnosis not present

## 2016-01-30 DIAGNOSIS — F0390 Unspecified dementia without behavioral disturbance: Secondary | ICD-10-CM | POA: Diagnosis not present

## 2016-01-30 DIAGNOSIS — F0280 Dementia in other diseases classified elsewhere without behavioral disturbance: Secondary | ICD-10-CM | POA: Diagnosis not present

## 2016-01-30 DIAGNOSIS — I10 Essential (primary) hypertension: Secondary | ICD-10-CM | POA: Diagnosis not present

## 2016-01-30 DIAGNOSIS — G309 Alzheimer's disease, unspecified: Secondary | ICD-10-CM | POA: Diagnosis not present

## 2016-01-30 LAB — LIPID PANEL
Cholesterol: 248 mg/dL — ABNORMAL HIGH (ref 0–200)
HDL: 51 mg/dL (ref >40)
LDL Cholesterol: 164 mg/dL — ABNORMAL HIGH (ref 0–99)
TRIGLYCERIDES: 167 mg/dL — AB (ref <150)
Total CHOL/HDL Ratio: 4.9 RATIO
VLDL: 33 mg/dL (ref 0–40)

## 2016-01-30 LAB — GLUCOSE, CAPILLARY: GLUCOSE-CAPILLARY: 212 mg/dL — AB (ref 65–99)

## 2016-01-30 MED ORDER — CLOPIDOGREL BISULFATE 75 MG PO TABS: 75.0000 mg | ORAL_TABLET | Freq: Every day | ORAL | 2 refills | Status: DC

## 2016-01-30 MED ORDER — LABETALOL HCL 5 MG/ML IV SOLN
5.0000 mg | INTRAVENOUS | Status: DC | PRN
  Administered 2016-01-30: 5 mg via INTRAVENOUS
  Filled 2016-01-30: qty 4

## 2016-01-30 NOTE — Progress Notes (Addendum)
Pt BP elevated 191/63. MD notified. MD ordered PRN Labetalol.

## 2016-01-30 NOTE — Care Management Note (Signed)
Case Management Note  Patient Details  Name: MAKHIYA COBURN MRN: [id number] Date of Birth: 1932-02-09  Subjective/Objective:   Patient adm from home with TIA, she lives with her daughter. She has a PCP and insurance. Patient has dementia, daughter at bedside offering information. Patient has a hospital bed, walker, BSC, and shower bench. Daughter would like patient  to have [place].  Offered choice, family is familiar with [facility] and would like to use them.                 Action/Plan: Romualdo Bolk of [facility] notified and will obtain orders from chart. Family made aware that [facility] has 48 hours to make first visit.  Expected Discharge Date:     01/30/2016             Expected Discharge Plan:  [place]  In-House Referral:  NA  Discharge planning Services  CM Consult  Post Acute Care Choice:  NA Choice offered to:  NA  DME Arranged:    DME Agency:     HH Arranged:    HH Agency:     Status of Service:  In process, will continue to follow  If discussed at Long Length of Stay Meetings, dates discussed:    Additional Comments:  Deny Chevez, Chauncey [place], RN 01/30/2016, 1:33 PM

## 2016-01-30 NOTE — Progress Notes (Signed)
Dawna Part discharged Home per MD order.  Discharge instructions reviewed and discussed with the patient and family, all questions and concerns answered. Copy of instructions given to family.    Medication List    STOP taking these medications   HYDROcodone-acetaminophen 5-325 MG tablet Commonly known as:  NORCO/VICODIN     TAKE these medications   aspirin 81 MG EC tablet Take 81 mg by mouth daily.   CALCIUM 600 PO Take 1 tablet by mouth 2 (two) times daily. 1 po daily   clopidogrel 75 MG tablet Commonly known as:  PLAVIX Take 1 tablet (75 mg total) by mouth daily. Start taking on:  01/31/2016   glucosamine-chondroitin 500-400 MG tablet Take 1 tablet by mouth 2 (two) times daily.   levothyroxine 100 MCG tablet Commonly known as:  SYNTHROID, LEVOTHROID Take 100 mcg by mouth.   NAMZARIC 28-10 MG Cp24 Generic drug:  Memantine HCl-Donepezil HCl TAKE 1 CAPSULE BEFORE BREAKFAST What changed:  See the new instructions.   OLANZapine 7.5 MG tablet Commonly known as:  ZYPREXA Take 1 tablet (7.5 mg total) by mouth at bedtime.   omeprazole 20 MG capsule Commonly known as:  PRILOSEC Take 20 mg by mouth 2 (two) times daily.   promethazine 25 MG tablet Commonly known as:  PHENERGAN Take 25 mg by mouth every 6 (six) hours as needed.   VESICARE 10 MG tablet Generic drug:  solifenacin TAKE ONE TABLET (10 MG TOTAL) BY MOUTH DAILY.   Vitamin D 1000 units capsule Take 5,000 Units by mouth daily.       Patients skin is clean, dry and intact, no evidence of skin break down. IV site discontinued and catheter remains intact. Site without signs and symptoms of complications. Dressing and pressure applied.  Patient escorted to car by NT in a wheelchair,  no distress noted upon discharge.  Suzanne Fritz Shia Eber 01/30/2016 3:22 PM

## 2016-01-30 NOTE — Progress Notes (Signed)
Pt had acute episode of increased confusion and then blank stare accompanied by no verbal response lasting approximately 1 minute. Pt then with baseline responses. VS stable. Dr Marin Comment notified and came to room to speak with family. MD and family agree to proceed with discharge.

## 2016-01-30 NOTE — Evaluation (Signed)
Physical Therapy Evaluation Patient Details Name: Suzanne Fritz MRN: [id number] DOB: [birth year] Today's Date: 01/30/2016   History of Present Illness  19 yof with a history of advanced dementia, DNR, HTN, and HLD presented with complaints of left facial droop, ataxia, and weakness. Her ictus was within 3 hours and she is not a candidate for TPA as her symptoms resolved on arrival. While in the ED, her VSS, head CT, and MRI were negative. She was admitted for further evaluation of possible TIA/CVA.  PMH: adenocarcinoma, advanced dementia, Alzheimer disease, anxiety, arthritis, back pain, behavior disorder, depression, hearing loss, HTN, hypothyroidism, lung CA with upper lobe removed, syncope and collapse in 2011, appendectomy, back surgery, R knee surgery, thyroidectomy.     Clinical Impression  Pt received in bed with multiple family members present, and pt is agreeable to PT evaluation.  Pt lives with her husband and dtr who provide 24/7 supervision/assistance.  Dtr states that at baseline she does not use a device for ambulation unless she is having vertigo, and then the family has her use a RW.  Dtr assists her with all ADL's and sequencing of tasks, however she is able to feed herself.  She does not frequently get out in the community, but dtr states that she mobilizes well at home and has not had any falls.  Today, she ambulated 230f with Min guard and HHA at times, but progressed to just Min guard.  Family and pt express that she is at baseline.  Family expressed that she would continue to have 24/7 supervision/assistance upon d/c.  Pt does not have any skilled PT needs at this time, therefore, will sign off.     Follow Up Recommendations No PT follow up;Supervision/Assistance - 24 hour    Equipment Recommendations  None recommended by PT    Recommendations for Other Services       Precautions / Restrictions Precautions Precautions: Fall Precaution Comments: No falls, but fall  precautions due to dementia. Restrictions Weight Bearing Restrictions: No      Mobility  Bed Mobility Overal bed mobility: Modified Independent Bed Mobility: Supine to Sit     Supine to sit: Modified independent (Device/Increase time)     General bed mobility comments: Verbal cuing for sequencing task  Transfers Overall transfer level: Needs assistance Equipment used: 1 person hand held assist Transfers: Sit to/from Stand              Ambulation/Gait Ambulation/Gait assistance: Min guard Ambulation Distance (Feet): 200 Feet Assistive device: None;1 person hand held assist Gait Pattern/deviations: Trunk flexed;Step-through pattern   Gait velocity interpretation: <1.8 ft/sec, indicative of risk for recurrent falls    Stairs            Wheelchair Mobility    Modified Rankin (Stroke Patients Only)       Balance Overall balance assessment: Needs assistance Sitting-balance support: Bilateral upper extremity supported Sitting balance-Leahy Scale: Good     Standing balance support: No upper extremity supported Standing balance-Leahy Scale: Fair                               Pertinent Vitals/Pain Pain Assessment: 0-10 Pain Score: 4  Pain Location: back  Pain Descriptors / Indicators: Throbbing Pain Intervention(s): Limited activity within patient's tolerance;Repositioned;Monitored during session    HSeven Mileexpects to be discharged to:: Private residence Living Arrangements: Spouse/significant other (dtr) Available Help at Discharge: Family;Available 24 hours/day   Home  Access: Stairs to enter   [employer] of Steps: 2-3 Home Layout: One level Home Equipment: Walker - 2 wheels;Cane - single point;Bedside commode;Shower seat;Hand held shower head;Hospital bed      Prior Function Level of Independence: Needs assistance   Gait / Transfers Assistance Needed: Pt has walker available, dtr reports she does not  use often  ADL's / Homemaking Assistance Needed: Dtr reports pt requires assistance and cuing for sequencing with all ADL tasks, is able to feed self  Comments: Family has pt use the RW if she has vertigo.       Hand Dominance   Dominant Hand: Right    Extremity/Trunk Assessment   Upper Extremity Assessment: Overall WFL for tasks assessed           Lower Extremity Assessment: Overall WFL for tasks assessed         Communication   Communication: HOH  Cognition Arousal/Alertness: Awake/alert Behavior During Therapy: WFL for tasks assessed/performed Overall Cognitive Status: History of cognitive impairments - at baseline                      General Comments      Exercises     Assessment/Plan    PT Assessment Patent does not need any further PT services  PT Problem List            PT Treatment Interventions Gait training;Functional mobility training;Therapeutic activities;Patient/family education    PT Goals (Current goals can be found in the Care Plan section)  Acute Rehab PT Goals Patient Stated Goal: to go home PT Goal Formulation: All assessment and education complete, DC therapy    Frequency     Barriers to discharge        Co-evaluation               End of Session Equipment Utilized During Treatment: Gait belt Activity Tolerance: Patient tolerated treatment well Patient left: in chair;with call bell/phone within reach;with family/visitor present      Functional Assessment Tool Used: [employer] "6-clicks"  Functional Limitation: Mobility: Walking and moving around Mobility: Walking and Moving Around Current Status [phone]): At least 20 percent but less than 40 percent impaired, limited or restricted Mobility: Walking and Moving Around Goal Status [phone]): At least 20 percent but less than 40 percent impaired, limited or restricted Mobility: Walking and Moving Around Discharge Status [phone]): At least 20 percent but  less than 40 percent impaired, limited or restricted    Time: 0950-1011 PT Time Calculation (min) (ACUTE ONLY): 21 min   Charges:   PT Evaluation $PT Eval Low Complexity: 1 Procedure     PT G Codes:   PT G-Codes **NOT FOR INPATIENT CLASS** Functional Assessment Tool Used: [employer] "6-clicks"  Functional Limitation: Mobility: Walking and moving around Mobility: Walking and Moving Around Current Status [phone]): At least 20 percent but less than 40 percent impaired, limited or restricted Mobility: Walking and Moving Around Goal Status [phone]): At least 20 percent but less than 40 percent impaired, limited or restricted Mobility: Walking and Moving Around Discharge Status [phone]): At least 20 percent but less than 40 percent impaired, limited or restricted    Beth Baylyn Sickles, PT, DPT X: [phone]

## 2016-01-30 NOTE — Care Management Obs Status (Signed)
[facility] NOTIFICATION   Patient Details  Name: LEYDI WINSTEAD MRN: [id number] Date of Birth: August 07, 1931   Medicare Observation Status Notification Given:  Yes    Jansen Sciuto, Chauncey Reading, RN 01/30/2016, 1:44 PM

## 2016-01-30 NOTE — Evaluation (Signed)
Occupational Therapy Evaluation Patient Details Name: Suzanne Fritz MRN: [id number] DOB: July 22, 1931 Today's Date: 01/30/2016    History of Present Illness Suzanne Fritz is an 82 y.o. female with hx of advanced dementia, DNR Code status, HTN, HLD, lives at home with her husband and her daughter, brought to the ER with left facial droop and ataxia with weakness.  Her ictus was within 3 hours, however, she was not a candidate for TPA as her symptoms resolved upon arrival to the ER.  She is quite confused, unable to give any reliable hx.  Family states that she has been compliant with taking her ASA, and she reportedly had no prior similar symptoms.  In the ER, her VSS, and her head CT was negative.    Clinical Impression   Pt sleeping on OT arrival, easily awakened by calling name. Pt oriented to person only, states she is at her daughter's house. Daughter present for evaluation, pt able to tell OT who her daughter is. Pt symptoms resolved on arrival to [facility], on evaluation sensation and coordination are intact, strength is weak bilaterally at baseline. Daughter reports pt has supervision at all times and requires assistance for all ADL tasks due to cognitive state. Pt has all necessary DME and AE. No further OT services required at this time as pt is at baseline with functional tasks and level of assistance required.    Follow Up Recommendations  No OT follow up;Supervision/Assistance - 24 hour    Equipment Recommendations  None recommended by OT       Precautions / Restrictions Precautions Precautions: Fall Precaution Comments: Pt requires supervision 24/7 due to Alzheimer's dementia      Mobility Bed Mobility Overal bed mobility: Needs Assistance Bed Mobility: Supine to Sit     Supine to sit: Min assist;HOB elevated     General bed mobility comments: Verbal cuing for sequencing task          ADL Overall ADL's : Needs assistance/impaired;At baseline Eating/Feeding: Set  up;Cueing for sequencing               Upper Body Dressing : Minimal assistance;Cueing for sequencing;Sitting                     General ADL Comments: Pt requires cuing for sequencing and initiation of task. Supervision required for assistance as needed during task     Vision Vision Assessment?: No apparent visual deficits          Pertinent Vitals/Pain Pain Assessment: No/denies pain     Hand Dominance Right   Extremity/Trunk Assessment Upper Extremity Assessment Upper Extremity Assessment: Generalized weakness   Lower Extremity Assessment Lower Extremity Assessment: Defer to PT evaluation       Communication Communication Communication: HOH   Cognition Arousal/Alertness: Awake/alert Behavior During Therapy: WFL for tasks assessed/performed Overall Cognitive Status: History of cognitive impairments - at baseline (alzheimer's dementia)                                Home Living Family/patient expects to be discharged to:: Private residence Living Arrangements: Spouse/significant other;Children (daughter) Available Help at Discharge: Family;Available 24 hours/day               Bathroom Shower/Tub: Tub/shower unit Shower/tub characteristics: Curtain Biochemist, clinical: Standard     Home Equipment: Environmental consultant - 2 wheels;Cane - single point;Bedside commode;Shower seat;Hand held shower head;Hospital bed  Prior Functioning/Environment Level of Independence: Needs assistance  Gait / Transfers Assistance Needed: Pt has walker available, dtr reports she does not use often ADL's / Homemaking Assistance Needed: Dtr reports pt requires assistance and cuing for sequencing with all ADL tasks, is able to feed self            OT Problem List: Decreased strength;Decreased cognition    End of Session    Activity Tolerance: Patient tolerated treatment well Patient left: in bed;with call bell/phone within reach;with family/visitor  present   Time: 3343-5686 OT Time Calculation (min): 23 min Charges:  OT General Charges $OT Visit: 1 Procedure OT Evaluation $OT Eval Low Complexity: 1 Procedure G-Codes: OT G-codes **NOT FOR INPATIENT CLASS** Functional Assessment Tool Used: clinical judgement Functional Limitation: Self care Self Care Current Status (H6837): At least 40 percent but less than 60 percent impaired, limited or restricted Self Care Goal Status (G9021): At least 40 percent but less than 60 percent impaired, limited or restricted Self Care Discharge Status [phone]): At least 40 percent but less than 60 percent impaired, limited or restricted   [name], OTR/L  [phone] 01/30/2016, 9:16 AM

## 2016-01-30 NOTE — Discharge Summary (Signed)
Physician Discharge Summary  Suzanne Fritz IRJ:[id number] DOB: 1931-06-11 DOA: 01/29/2016  PCP: Ardelle Anton, MD  Admit date: 01/29/2016 Discharge date: 01/30/2016  Admitted From:  Home  Disposition: Home   Recommendations for Outpatient Follow-up:  1. Follow up with PCP in 1-2 weeks  Home Health: No  Equipment/Devices: None   Discharge Condition: Stable  CODE STATUS: DNR  Diet recommendation: Heart healthy   Brief/Interim Summary: 73 yof with a history of advanced dementia, DNR, HTN, and HLD presented with complaints of left facial droop, ataxia, and weakness. Her ictus was within 3 hours and she is not a candidate for TPA as her symptoms resolved on arrival. While in the ED, her VSS, head CT, and MRI were negative. She was admitted for further evaluation of possible TIA/CVA. Her MRI was negative, ECHO was unremarkable and she had no critical carotid stenosis. She will be discharged on DUAT and follow up with her PCP next week.   Discharge Diagnoses:  Principal Problem:   TIA (transient ischemic attack) Active Problems:   HTN (hypertension)   Hyperlipidemia   Dementia in Alzheimer's disease   Advanced dementia    Discharge Instructions    Diet - low sodium heart healthy    Complete by:  As directed    Increase activity slowly    Complete by:  As directed        Medication List    STOP taking these medications   HYDROcodone-acetaminophen 5-325 MG tablet Commonly known as:  NORCO/VICODIN     TAKE these medications   aspirin 81 MG EC tablet Take 81 mg by mouth daily.   CALCIUM 600 PO Take 1 tablet by mouth 2 (two) times daily. 1 po daily   clopidogrel 75 MG tablet Commonly known as:  PLAVIX Take 1 tablet (75 mg total) by mouth daily. Start taking on:  01/31/2016   glucosamine-chondroitin 500-400 MG tablet Take 1 tablet by mouth 2 (two) times daily.   levothyroxine 100 MCG tablet Commonly known as:  SYNTHROID, LEVOTHROID Take 100 mcg by mouth.    NAMZARIC 28-10 MG Cp24 Generic drug:  Memantine HCl-Donepezil HCl TAKE 1 CAPSULE BEFORE BREAKFAST What changed:  See the new instructions.   OLANZapine 7.5 MG tablet Commonly known as:  ZYPREXA Take 1 tablet (7.5 mg total) by mouth at bedtime.   omeprazole 20 MG capsule Commonly known as:  PRILOSEC Take 20 mg by mouth 2 (two) times daily.   promethazine 25 MG tablet Commonly known as:  PHENERGAN Take 25 mg by mouth every 6 (six) hours as needed.   VESICARE 10 MG tablet Generic drug:  solifenacin TAKE ONE TABLET (10 MG TOTAL) BY MOUTH DAILY.   Vitamin D 1000 units capsule Take 5,000 Units by mouth daily.       Allergies  Allergen Reactions  . Codeine     Consultations:  None.   Procedures/Studies: Ct Head Wo Contrast  Result Date: 01/29/2016 CLINICAL DATA:  Left-sided weakness and facial droop. EXAM: CT HEAD WITHOUT CONTRAST TECHNIQUE: Contiguous axial images were obtained from the base of the skull through the vertex without intravenous contrast. COMPARISON:  05/10/2010 FINDINGS: Brain: Slightly progressive cerebral atrophy, ventriculomegaly and periventricular white matter disease. No extra-axial fluid collections are identified. No CT findings for acute hemispheric infarction or intracranial hemorrhage. No mass lesions. The brainstem and cerebellum are normal. Vascular: Stable vascular calcifications. No definite aneurysm or hyperdense vessels. Skull: No skull fracture or bone lesion. Mild hyperostosis frontalis interna. Sinuses/Orbits: The paranasal sinuses and mastoid  air cells are clear. The globes are intact. Other: No scalp lesions or hematoma. IMPRESSION: Slightly progressive age related cerebral atrophy, ventriculomegaly and periventricular white matter disease. No acute intracranial findings or mass lesion. Electronically Signed   By: Marijo Sanes M.D.   On: 01/29/2016 14:19   Mr Jodene Nam Head Wo Contrast  Result Date: 01/29/2016 CLINICAL DATA:  Acute weakness with  left-sided facial droop beginning 6 hours ago. EXAM: MRI HEAD WITHOUT CONTRAST MRA HEAD WITHOUT CONTRAST TECHNIQUE: Multiplanar, multiecho pulse sequences of the brain and surrounding structures were obtained without intravenous contrast. Angiographic images of the head were obtained using MRA technique without contrast. COMPARISON:  CT same day FINDINGS: MRI HEAD FINDINGS Brain: Diffusion imaging does not show any acute or subacute infarction. There chronic small-vessel ischemic changes affecting the pons. No cerebellar abnormality. Cerebral hemispheres show generalized atrophy with mild chronic small-vessel change of the white matter, less than often seen in healthy individuals of this age. No cortical or large vessel territory infarction. No mass lesion, hemorrhage, hydrocephalus or extra-axial collection. There is a right parietal convexity arachnoid cyst not of any clinical relevance. No pituitary mass. Vascular: Major vessels at the base of the brain show flow. Skull and upper cervical spine: Negative Sinuses/Orbits: Clear/negative Other: None significant MRA HEAD FINDINGS Both internal carotid arteries are widely patent into the brain. The anterior and middle cerebral vessels are patent without proximal stenosis, aneurysm or vascular malformation. There is mild narrowing of the M1 segment on the right, estimated at 30%. Both vertebral arteries are patent with the left being dominant. No basilar stenosis. Posterior circulation branch vessels are patent proximally. More distal branch vessels are poorly seen, probably related to technical factors. IMPRESSION: No acute finding. Essentially normal study for a person of this age. Very minimal chronic small-vessel change of the hemispheric white matter. Ordinary age related volume loss. No major vessel occlusion or correctable proximal stenosis. Mild narrowing of the distal M1 segment on the right, no more than 30%. Electronically Signed   By: Nelson Chimes M.D.    On: 01/29/2016 18:42   Mr Brain Wo Contrast  Result Date: 01/29/2016 CLINICAL DATA:  Acute weakness with left-sided facial droop beginning 6 hours ago. EXAM: MRI HEAD WITHOUT CONTRAST MRA HEAD WITHOUT CONTRAST TECHNIQUE: Multiplanar, multiecho pulse sequences of the brain and surrounding structures were obtained without intravenous contrast. Angiographic images of the head were obtained using MRA technique without contrast. COMPARISON:  CT same day FINDINGS: MRI HEAD FINDINGS Brain: Diffusion imaging does not show any acute or subacute infarction. There chronic small-vessel ischemic changes affecting the pons. No cerebellar abnormality. Cerebral hemispheres show generalized atrophy with mild chronic small-vessel change of the white matter, less than often seen in healthy individuals of this age. No cortical or large vessel territory infarction. No mass lesion, hemorrhage, hydrocephalus or extra-axial collection. There is a right parietal convexity arachnoid cyst not of any clinical relevance. No pituitary mass. Vascular: Major vessels at the base of the brain show flow. Skull and upper cervical spine: Negative Sinuses/Orbits: Clear/negative Other: None significant MRA HEAD FINDINGS Both internal carotid arteries are widely patent into the brain. The anterior and middle cerebral vessels are patent without proximal stenosis, aneurysm or vascular malformation. There is mild narrowing of the M1 segment on the right, estimated at 30%. Both vertebral arteries are patent with the left being dominant. No basilar stenosis. Posterior circulation branch vessels are patent proximally. More distal branch vessels are poorly seen, probably related to technical factors. IMPRESSION:  No acute finding. Essentially normal study for a person of this age. Very minimal chronic small-vessel change of the hemispheric white matter. Ordinary age related volume loss. No major vessel occlusion or correctable proximal stenosis. Mild  narrowing of the distal M1 segment on the right, no more than 30%. Electronically Signed   By: Nelson Chimes M.D.   On: 01/29/2016 18:42   US Carotid Bilateral (at [facility] And [facility] Only)  Result Date: 01/30/2016 CLINICAL DATA:  Left facial droop, ataxia, weakness. History of hypertension, coronary disease, hyperlipidemia. EXAM: BILATERAL CAROTID DUPLEX ULTRASOUND TECHNIQUE: Pearline Cables scale imaging, color Doppler and duplex ultrasound were performed of bilateral carotid and vertebral arteries in the neck. COMPARISON:  01/29/2016 brain MRI FINDINGS: Criteria: Quantification of carotid stenosis is based on velocity parameters that correlate the residual internal carotid diameter with NASCET-based stenosis levels, using the diameter of the distal internal carotid lumen as the denominator for stenosis measurement. The following velocity measurements were obtained: RIGHT ICA:  88/11 cm/sec CCA:  40/34 cm/sec SYSTOLIC ICA/CCA RATIO:  0.9 DIASTOLIC ICA/CCA RATIO:  1.1 ECA:  161 cm/sec LEFT ICA:  113/16 cm/sec CCA:  74/25 cm/sec SYSTOLIC ICA/CCA RATIO:  1.2 DIASTOLIC ICA/CCA RATIO:  1.6 ECA:  177 cm/sec RIGHT CAROTID ARTERY: Mild scattered heterogeneous atherosclerotic plaque formation. No hemodynamically significant right ICA stenosis, velocity elevation, or turbulent flow. Degree of narrowing less than 50%. RIGHT VERTEBRAL ARTERY:  Antegrade LEFT CAROTID ARTERY: Similar scattered mild heterogeneous atherosclerotic plaque formation. No hemodynamically significant left ICA stenosis, velocity elevation, or turbulent flow. LEFT VERTEBRAL ARTERY:  Antegrade IMPRESSION: Mild bilateral carotid atherosclerosis. No hemodynamically significant ICA stenosis. Degree of narrowing less than 50% bilaterally. Patent antegrade vertebral flow bilaterally Electronically Signed   By: Jerilynn Mages.  Shick M.D.   On: 01/30/2016 09:50   Subjective:  Discharge Exam: Vitals:   01/30/16 0700 01/30/16 1100  BP: (!) 176/74 126/61  Pulse: 69 (!) 56  Resp: 18  18  Temp: 98.3 F (36.8 C) 98.5 F (36.9 C)   Vitals:   01/30/16 0234 01/30/16 0434 01/30/16 0700 01/30/16 1100  BP: (!) 191/63 138/64 (!) 176/74 126/61  Pulse: (!) 58 62 69 (!) 56  Resp: '20 20 18 18  '$ Temp: 98 F (36.7 C) 97.9 F (36.6 C) 98.3 F (36.8 C) 98.5 F (36.9 C)  TempSrc: Oral Oral Oral Oral  SpO2: 100% 99% 97% 100%  Weight:      Height:        General: Pt is alert, awake, not in acute distress Cardiovascular: RRR, S1/S2 +, no rubs, no gallops Respiratory: CTA bilaterally, no wheezing, no rhonchi Abdominal: Soft, NT, ND, bowel sounds + Extremities: no edema, no cyanosis    The results of significant diagnostics from this hospitalization (including imaging, microbiology, ancillary and laboratory) are listed below for reference.     Basic Metabolic Panel:  Recent Labs Lab 01/29/16 1326 01/29/16 1333  NA 143 141  K 4.3 4.0  CL 106 108  CO2  --  29  GLUCOSE 113* 77  BUN 24* 21*  CREATININE 1.10* 1.13*  CALCIUM  --  9.6   Liver Function Tests:  Recent Labs Lab 01/29/16 1333  AST 25  ALT 16  ALKPHOS 80  BILITOT 0.5  PROT 7.4  ALBUMIN 4.0   CBC:  Recent Labs Lab 01/29/16 1326 01/29/16 1333  WBC  --  7.5  NEUTROABS  --  4.6  HGB 12.9 12.6  HCT 38.0 38.6  MCV  --  91.9  PLT  --  230   CBG:  Recent Labs Lab 01/30/16 1218  GLUCAP 212*   Lipid Profile  Recent Labs  01/30/16 0603  CHOL 248*  HDL 51  LDLCALC 164*  TRIG 167*  CHOLHDL 4.9   Urinalysis    Component Value Date/Time   COLORURINE YELLOW 01/29/2016 1306   APPEARANCEUR CLOUDY (A) 01/29/2016 1306   LABSPEC >1.030 (H) 01/29/2016 1306   PHURINE 5.5 01/29/2016 1306   GLUCOSEU NEGATIVE 01/29/2016 1306   HGBUR NEGATIVE 01/29/2016 1306   BILIRUBINUR NEGATIVE 01/29/2016 1306   BILIRUBINUR negative 05/13/2013 1718   KETONESUR NEGATIVE 01/29/2016 1306   PROTEINUR NEGATIVE 01/29/2016 1306   UROBILINOGEN negative 05/13/2013 1718   UROBILINOGEN 0.2 09/25/2010 1410    NITRITE NEGATIVE 01/29/2016 1306   LEUKOCYTESUR MODERATE (A) 01/29/2016 1306    Time coordinating discharge: Over 30 minutes  SIGNED:  Orvan Falconer, MD FACP [facility] 01/30/2016, 1:42 PM   If 7PM-7AM, please contact night-coverage [email or web address] Password [email or web address]

## 2016-01-31 LAB — HEMOGLOBIN A1C
Hgb A1c MFr Bld: 5.1 % (ref 4.8–5.6)
MEAN PLASMA GLUCOSE: 100 mg/dL

## 2016-02-02 ENCOUNTER — Telehealth: Payer: Self-pay | Admitting: Neurology

## 2016-02-02 NOTE — Telephone Encounter (Signed)
Patient daughter reports new TIA that started 01/29/16- pt was admitted to ED.  An appointment was made for 03/20/16 @ 1:30.  Advised that the nurse would call if there was any other questions.

## 2016-02-05 ENCOUNTER — Encounter (HOSPITAL_COMMUNITY): Payer: Self-pay | Admitting: Cardiology

## 2016-02-05 ENCOUNTER — Emergency Department (HOSPITAL_COMMUNITY): Payer: Medicare Other

## 2016-02-05 ENCOUNTER — Inpatient Hospital Stay (HOSPITAL_COMMUNITY)
Admission: EM | Admit: 2016-02-05 | Discharge: 2016-02-07 | DRG: 101 | Disposition: A | Discharge status: Hospice | Payer: Medicare Other | Attending: Internal Medicine | Admitting: Internal Medicine

## 2016-02-05 DIAGNOSIS — M6281 Muscle weakness (generalized): Secondary | ICD-10-CM

## 2016-02-05 DIAGNOSIS — K219 Gastro-esophageal reflux disease without esophagitis: Secondary | ICD-10-CM | POA: Diagnosis present

## 2016-02-05 DIAGNOSIS — H9190 Unspecified hearing loss, unspecified ear: Secondary | ICD-10-CM | POA: Diagnosis present

## 2016-02-05 DIAGNOSIS — E785 Hyperlipidemia, unspecified: Secondary | ICD-10-CM | POA: Diagnosis present

## 2016-02-05 DIAGNOSIS — R41841 Cognitive communication deficit: Secondary | ICD-10-CM

## 2016-02-05 DIAGNOSIS — N390 Urinary tract infection, site not specified: Secondary | ICD-10-CM | POA: Diagnosis not present

## 2016-02-05 DIAGNOSIS — F0390 Unspecified dementia without behavioral disturbance: Secondary | ICD-10-CM | POA: Diagnosis present

## 2016-02-05 DIAGNOSIS — Z85118 Personal history of other malignant neoplasm of bronchus and lung: Secondary | ICD-10-CM

## 2016-02-05 DIAGNOSIS — F03C0 Unspecified dementia, severe, without behavioral disturbance, psychotic disturbance, mood disturbance, and anxiety: Secondary | ICD-10-CM | POA: Diagnosis present

## 2016-02-05 DIAGNOSIS — Z7902 Long term (current) use of antithrombotics/antiplatelets: Secondary | ICD-10-CM

## 2016-02-05 DIAGNOSIS — E039 Hypothyroidism, unspecified: Secondary | ICD-10-CM | POA: Diagnosis present

## 2016-02-05 DIAGNOSIS — F0280 Dementia in other diseases classified elsewhere without behavioral disturbance: Secondary | ICD-10-CM | POA: Diagnosis present

## 2016-02-05 DIAGNOSIS — G309 Alzheimer's disease, unspecified: Secondary | ICD-10-CM | POA: Diagnosis present

## 2016-02-05 DIAGNOSIS — Z515 Encounter for palliative care: Secondary | ICD-10-CM | POA: Diagnosis present

## 2016-02-05 DIAGNOSIS — I10 Essential (primary) hypertension: Secondary | ICD-10-CM | POA: Diagnosis present

## 2016-02-05 DIAGNOSIS — Z7982 Long term (current) use of aspirin: Secondary | ICD-10-CM

## 2016-02-05 DIAGNOSIS — Z87891 Personal history of nicotine dependence: Secondary | ICD-10-CM

## 2016-02-05 DIAGNOSIS — E7800 Pure hypercholesterolemia, unspecified: Secondary | ICD-10-CM | POA: Diagnosis present

## 2016-02-05 DIAGNOSIS — Z79899 Other long term (current) drug therapy: Secondary | ICD-10-CM

## 2016-02-05 DIAGNOSIS — Z8673 Personal history of transient ischemic attack (TIA), and cerebral infarction without residual deficits: Secondary | ICD-10-CM

## 2016-02-05 DIAGNOSIS — G40209 Localization-related (focal) (partial) symptomatic epilepsy and epileptic syndromes with complex partial seizures, not intractable, without status epilepticus: Secondary | ICD-10-CM | POA: Diagnosis not present

## 2016-02-05 DIAGNOSIS — B9620 Unspecified Escherichia coli [E. coli] as the cause of diseases classified elsewhere: Secondary | ICD-10-CM | POA: Diagnosis present

## 2016-02-05 DIAGNOSIS — E860 Dehydration: Secondary | ICD-10-CM | POA: Diagnosis present

## 2016-02-05 DIAGNOSIS — Z66 Do not resuscitate: Secondary | ICD-10-CM | POA: Diagnosis present

## 2016-02-05 DIAGNOSIS — R55 Syncope and collapse: Secondary | ICD-10-CM

## 2016-02-05 LAB — URINALYSIS, ROUTINE W REFLEX MICROSCOPIC
BILIRUBIN URINE: NEGATIVE
Glucose, UA: NEGATIVE mg/dL
Ketones, ur: NEGATIVE mg/dL
Nitrite: NEGATIVE
PH: 7 (ref 5.0–8.0)
Protein, ur: NEGATIVE mg/dL

## 2016-02-05 LAB — URINE MICROSCOPIC-ADD ON

## 2016-02-05 LAB — CBC
HEMATOCRIT: 38.3 % (ref 36.0–46.0)
HEMOGLOBIN: 12.4 g/dL (ref 12.0–15.0)
MCH: 29.5 pg (ref 26.0–34.0)
MCHC: 32.4 g/dL (ref 30.0–36.0)
MCV: 91.2 fL (ref 78.0–100.0)
Platelets: 214 10*3/uL (ref 150–400)
RBC: 4.2 MIL/uL (ref 3.87–5.11)
RDW: 13.1 % (ref 11.5–15.5)
WBC: 11 10*3/uL — ABNORMAL HIGH (ref 4.0–10.5)

## 2016-02-05 LAB — BASIC METABOLIC PANEL
ANION GAP: 7 (ref 5–15)
BUN: 18 mg/dL (ref 6–20)
CALCIUM: 9.4 mg/dL (ref 8.9–10.3)
CHLORIDE: 102 mmol/L (ref 101–111)
CO2: 29 mmol/L (ref 22–32)
Creatinine, Ser: 1.22 mg/dL — ABNORMAL HIGH (ref 0.44–1.00)
GFR calc non Af Amer: 40 mL/min — ABNORMAL LOW (ref >60)
GFR, EST AFRICAN AMERICAN: 46 mL/min — AB (ref >60)
GLUCOSE: 122 mg/dL — AB (ref 65–99)
POTASSIUM: 3.8 mmol/L (ref 3.5–5.1)
Sodium: 138 mmol/L (ref 135–145)

## 2016-02-05 LAB — TROPONIN I

## 2016-02-05 LAB — TSH: TSH: 2.976 u[IU]/mL (ref 0.350–4.500)

## 2016-02-05 MED ORDER — DARIFENACIN HYDROBROMIDE ER 7.5 MG PO TB24
15.0000 mg | ORAL_TABLET | Freq: Every day | ORAL | Status: DC
  Administered 2016-02-06 – 2016-02-07 (×2): 15 mg via ORAL
  Filled 2016-02-05 (×3): qty 2

## 2016-02-05 MED ORDER — ACETAMINOPHEN 650 MG RE SUPP
650.0000 mg | Freq: Four times a day (QID) | RECTAL | Status: DC | PRN
Start: 2016-02-05 — End: 2016-02-07

## 2016-02-05 MED ORDER — DEXTROSE 5 % IV SOLN
1.0000 g | Freq: Once | INTRAVENOUS | Status: AC
  Administered 2016-02-05: 1 g via INTRAVENOUS
  Filled 2016-02-05: qty 10

## 2016-02-05 MED ORDER — ONDANSETRON HCL 4 MG PO TABS: 4.0000 mg | ORAL_TABLET | Freq: Four times a day (QID) | ORAL | Status: DC | PRN

## 2016-02-05 MED ORDER — OLANZAPINE 5 MG PO TABS
7.5000 mg | ORAL_TABLET | Freq: Every day | ORAL | Status: DC
  Administered 2016-02-05 – 2016-02-06 (×2): 7.5 mg via ORAL
  Filled 2016-02-05 (×2): qty 2

## 2016-02-05 MED ORDER — MORPHINE SULFATE (PF) 2 MG/ML IV SOLN: 2.0000 mg | INTRAVENOUS | Status: DC | PRN

## 2016-02-05 MED ORDER — DOCUSATE SODIUM 100 MG PO CAPS
100.0000 mg | ORAL_CAPSULE | Freq: Two times a day (BID) | ORAL | Status: DC
  Administered 2016-02-05 – 2016-02-07 (×4): 100 mg via ORAL
  Filled 2016-02-05 (×4): qty 1

## 2016-02-05 MED ORDER — ACETAMINOPHEN 325 MG PO TABS
650.0000 mg | ORAL_TABLET | Freq: Four times a day (QID) | ORAL | Status: DC | PRN
  Administered 2016-02-05 – 2016-02-07 (×3): 650 mg via ORAL
  Filled 2016-02-05 (×3): qty 2

## 2016-02-05 MED ORDER — LEVOTHYROXINE SODIUM 100 MCG PO TABS
100.0000 ug | ORAL_TABLET | Freq: Every day | ORAL | Status: DC
  Administered 2016-02-06 – 2016-02-07 (×2): 100 ug via ORAL
  Filled 2016-02-05 (×3): qty 1

## 2016-02-05 MED ORDER — ONDANSETRON HCL 4 MG/2ML IJ SOLN: 4.0000 mg | Freq: Four times a day (QID) | INTRAMUSCULAR | Status: DC | PRN

## 2016-02-05 MED ORDER — SODIUM CHLORIDE 0.9 % IV SOLN
INTRAVENOUS | Status: AC
  Administered 2016-02-05: 21:00:00 via INTRAVENOUS

## 2016-02-05 MED ORDER — ENOXAPARIN SODIUM 40 MG/0.4ML ~~LOC~~ SOLN
40.0000 mg | SUBCUTANEOUS | Status: DC
  Administered 2016-02-05 – 2016-02-06 (×2): 40 mg via SUBCUTANEOUS
  Filled 2016-02-05 (×2): qty 0.4

## 2016-02-05 MED ORDER — CLOPIDOGREL BISULFATE 75 MG PO TABS
75.0000 mg | ORAL_TABLET | Freq: Every day | ORAL | Status: DC
Start: 2016-02-06 — End: 2016-02-07
  Administered 2016-02-06 – 2016-02-07 (×2): 75 mg via ORAL
  Filled 2016-02-05 (×3): qty 1

## 2016-02-05 MED ORDER — MEMANTINE HCL-DONEPEZIL HCL ER 28-10 MG PO CP24
1.0000 | ORAL_CAPSULE | Freq: Every day | ORAL | Status: DC
  Filled 2016-02-05 (×2): qty 1

## 2016-02-05 MED ORDER — PANTOPRAZOLE SODIUM 40 MG PO TBEC
40.0000 mg | DELAYED_RELEASE_TABLET | Freq: Every day | ORAL | Status: DC
  Administered 2016-02-05 – 2016-02-07 (×3): 40 mg via ORAL
  Filled 2016-02-05 (×4): qty 1

## 2016-02-05 MED ORDER — ASPIRIN EC 81 MG PO TBEC
81.0000 mg | DELAYED_RELEASE_TABLET | Freq: Every day | ORAL | Status: DC
  Administered 2016-02-06: 81 mg via ORAL
  Filled 2016-02-05 (×2): qty 1

## 2016-02-05 NOTE — Telephone Encounter (Signed)
I was going to offer the pt a sooner appt of tomorrow but it appears that pt is in the hospital. I will call the pt tomorrow.

## 2016-02-05 NOTE — ED Triage Notes (Signed)
EMS called out for syncopal episode.   Pt was unresponsive when EMS arrived.   CBG 146.  B/p 60/40 .  NSR.  Pt is confused, has history of alzheimers.

## 2016-02-05 NOTE — ED Provider Notes (Signed)
[facility] DEPT Provider Note   CSN: [id number] Arrival date & time: 02/05/16  1255     History   Chief Complaint Chief Complaint  Patient presents with  . Loss of Consciousness    HPI Taccara Bushnell Higdon is a 82 y.o. female.  The history is provided by the EMS personnel, the patient and a caregiver. The history is limited by the condition of the patient (Hx dementia).  Loss of Consciousness    Pt was seen at 1325. Per EMS and pt's family: Pt with one episode of "unresponsiveness" PTA. Pt's family states she was sitting and "then just stared off and wouldn't respond to [place]." Pt "wouldn't move her arms or legs." EMS states pt was unresponsive on their arrival to scene, with BP "60/40." Pt more awake/alert by arrival to the ED. No reported shaking episode, no fall. Pt does c/o right sided "crick in my neck" for the past week. Denies injury. No focal motor weakness, no tingling/numbness in extremities, no CP/SOB, no abd pain, no N/V/D.   Past Medical History:  Diagnosis Date  . Adenocarcinoma ([place])   . Advanced dementia 05/30/2014  . Alzheimer disease   . Anxiety    STRESS AND MENOPAUSAL SYMPTOMS  . Arthritis   . Back pain   . Behavior disorder    REM  . Clinical depression 05/30/2014  . Dementia   . Depression   . GERD (gastroesophageal reflux disease)   . Hearing difficulty    DECREASED HEARING  . Hearing loss   . Hypercholesterolemia   . Hyperlipidemia   . Hypertension   . Hypothyroidism   . Lung cancer ([place]) 04/2010   upper lobe removed   . Memory problem   . Migraine   . Osteoarthritis    RIGHT KNEE  . Peripheral neuropathy ([place])   . Pneumonia 03/2014  . Syncope and collapse 02/2010   HOSPITALIZED    Patient Active Problem List   Diagnosis Date Noted  . TIA (transient ischemic attack) 01/29/2016  . Alzheimer's disease 09/12/2014  . Advanced dementia 05/30/2014  . Head revolving around 05/30/2014  . Clinical depression 05/30/2014  . Anemia, iron deficiency  02/14/2014  . Dementia in Alzheimer's disease 09/16/2012  . Bronchogenic lung cancer ([place]) 09/16/2012  . Preop cardiovascular exam 09/12/2010  . HTN (hypertension) 09/12/2010  . Hyperlipidemia 09/12/2010    Past Surgical History:  Procedure Laterality Date  . APPENDECTOMY    . BACK SURGERY    . CHOLECYSTECTOMY    . HEMORRHOID SURGERY    . KNEE SURGERY     RIGHT SIDE  . Left upper lobe resection  04/2010  . THYROIDECTOMY    . VAGINAL HYSTERECTOMY      OB History    No data available       Home Medications    Prior to Admission medications   Medication Sig Start Date End Date Taking? Authorizing Provider  Ascorbic Acid (VITAMIN C WITH ROSE HIPS) 500 MG tablet Take 500 mg by mouth daily.   Yes Historical Provider, MD  aspirin 81 MG EC tablet Take 81 mg by mouth daily.     Yes Historical Provider, MD  Calcium Carbonate (CALCIUM 600 PO) Take 1 tablet by mouth 2 (two) times daily. 1 po daily    Yes Historical Provider, MD  Cholecalciferol (VITAMIN D) 1000 UNITS capsule Take 5,000 Units by mouth daily.    Yes Historical Provider, MD  clopidogrel (PLAVIX) 75 MG tablet Take 1 tablet (75 mg total) by  mouth daily. 01/31/16  Yes Orvan Falconer, MD  glucosamine-chondroitin 500-400 MG tablet Take 1 tablet by mouth 2 (two) times daily.    Yes Historical Provider, MD  levothyroxine (SYNTHROID, LEVOTHROID) 100 MCG tablet Take 100 mcg by mouth.    Yes Historical Provider, MD  NAMZARIC 28-10 MG CP24 TAKE 1 CAPSULE BEFORE BREAKFAST 01/02/16  Yes Asencion Partridge Dohmeier, MD  OLANZapine (ZYPREXA) 7.5 MG tablet Take 1 tablet (7.5 mg total) by mouth at bedtime. 12/07/15  Yes Carmen Dohmeier, MD  omeprazole (PRILOSEC) 20 MG capsule Take 20 mg by mouth daily as needed.    Yes Historical Provider, MD  VESICARE 10 MG tablet TAKE ONE TABLET (10 MG TOTAL) BY MOUTH DAILY. 05/26/15  Yes Historical Provider, MD  promethazine (PHENERGAN) 25 MG tablet Take 25 mg by mouth every 6 (six) hours as needed.      Historical Provider,  MD    Family History Family History  Problem Relation Age of Onset  . Stroke Mother 55  . Cancer Father 21    Stomach  . Coronary artery disease Brother     2 brothers , but alive  . Migraines Child     Social History Social History  Substance Use Topics  . Smoking status: Former Smoker    Quit date: 04/29/1974  . Smokeless tobacco: Never Used  . Alcohol use No     Allergies   Codeine   Review of Systems Review of Systems  Unable to perform ROS: Dementia  Cardiovascular: Positive for syncope.     Physical Exam Updated Vital Signs BP 159/67   Pulse 64   Temp 98.9 F (37.2 C) (Oral)   Resp 15   SpO2 96%    Patient Vitals for the past 24 hrs:  BP Temp Temp src Pulse Resp SpO2  02/05/16 1730 159/67 - - 64 15 96 %  02/05/16 1720 - 98.9 F (37.2 C) Oral - - -  02/05/16 1700 191/63 - - 68 15 100 %  02/05/16 1630 - - - 76 15 97 %  02/05/16 1533 196/79 - - 71 16 94 %  02/05/16 1500 195/64 - - 73 14 97 %  02/05/16 1445 180/67 - - 69 17 97 %  02/05/16 1400 166/74 - - 72 18 97 %  02/05/16 1330 (!) 173/49 - - 71 17 96 %     Physical Exam 1330: Physical examination:  Nursing notes reviewed; Vital signs and O2 SAT reviewed;  Constitutional: Well developed, Well nourished, Well hydrated, In no acute distress; Head:  Normocephalic, atraumatic; Eyes: EOMI, PERRL, No scleral icterus; ENMT: Mouth and pharynx normal, Mucous membranes moist; Neck: Supple, Full range of motion, No lymphadenopathy; Cardiovascular: Regular rate and rhythm, No gallop; Respiratory: Breath sounds clear & equal bilaterally, No wheezes.  Speaking full sentences with ease, Normal respiratory effort/excursion; Chest: Nontender, Movement normal; Abdomen: Soft, Nontender, Nondistended, Normal bowel sounds; Genitourinary: No CVA tenderness; Spine:  No midline CS, TS, LS tenderness. +TTP right hypertonic trapezius and SCM muscles. No rash.;; Extremities: Pulses normal, No tenderness, No edema, No calf edema or  asymmetry.; Neuro: Awake, alert, confused per hx dementia. No facial droop. Major CN grossly intact.  Speech clear. Grips equal. Moves all extremities spontaneously and to command without apparent gross focal motor deficits..; Skin: Color normal, Warm, Dry.   ED Treatments / Results  Labs (all labs ordered are listed, but only abnormal results are displayed)   EKG  EKG Interpretation  Date/Time:  Monday February 05 2016 13:08:08  EDT Ventricular Rate:  68 PR Interval:    QRS Duration: 76 QT Interval:  416 QTC Calculation: 443 R Axis:   54 Text Interpretation:  Sinus rhythm Abnormal T, consider ischemia, lateral leads When compared with ECG of 01/29/2016 T wave abnormality Lateral leads is more pronounced Confirmed by [facility]  MD, Nunzio Cory [phone]) on 02/05/2016 1:43:14 PM       Radiology   Procedures Procedures (including critical care time)  Medications Ordered in ED Medications  morphine 2 MG/ML injection 2 mg (not administered)  cefTRIAXone (ROCEPHIN) 1 g in dextrose 5 % 50 mL IVPB (0 g Intravenous Stopped 02/05/16 1705)     Initial Impression / Assessment and Plan / ED Course  I have reviewed the triage vital signs and the nursing notes.  Pertinent labs & imaging results that were available during my care of the patient were reviewed by me and considered in my medical decision making (see chart for details).  MDM Reviewed: previous chart, nursing note and vitals Reviewed previous: labs and ECG Interpretation: labs, ECG, x-ray and CT scan   Results for orders placed or performed during the hospital encounter of 11/94/17  Basic metabolic panel  Result Value Ref Range   Sodium 138 135 - 145 mmol/L   Potassium 3.8 3.5 - 5.1 mmol/L   Chloride 102 101 - 111 mmol/L   CO2 29 22 - 32 mmol/L   Glucose, Bld 122 (H) 65 - 99 mg/dL   BUN 18 6 - 20 mg/dL   Creatinine, Ser 1.22 (H) 0.44 - 1.00 mg/dL   Calcium 9.4 8.9 - 10.3 mg/dL   GFR calc non Af Amer 40 (L) >60 mL/min   GFR  calc Af Amer 46 (L) >60 mL/min   Anion gap 7 5 - 15  CBC  Result Value Ref Range   WBC 11.0 (H) 4.0 - 10.5 K/uL   RBC 4.20 3.87 - 5.11 MIL/uL   Hemoglobin 12.4 12.0 - 15.0 g/dL   HCT 38.3 36.0 - 46.0 %   MCV 91.2 78.0 - 100.0 fL   MCH 29.5 26.0 - 34.0 pg   MCHC 32.4 30.0 - 36.0 g/dL   RDW 13.1 11.5 - 15.5 %   Platelets 214 150 - 400 K/uL  Urinalysis, Routine w reflex microscopic  Result Value Ref Range   Color, Urine YELLOW YELLOW   APPearance CLEAR CLEAR   Specific Gravity, Urine <1.005 (L) 1.005 - 1.030   pH 7.0 5.0 - 8.0   Glucose, UA NEGATIVE NEGATIVE mg/dL   Hgb urine dipstick TRACE (A) NEGATIVE   Bilirubin Urine NEGATIVE NEGATIVE   Ketones, ur NEGATIVE NEGATIVE mg/dL   Protein, ur NEGATIVE NEGATIVE mg/dL   Nitrite NEGATIVE NEGATIVE   Leukocytes, UA TRACE (A) NEGATIVE  Troponin I  Result Value Ref Range   Troponin I <0.03 <0.03 ng/mL  Urine microscopic-add on  Result Value Ref Range   Squamous Epithelial / LPF 0-5 (A) NONE SEEN   WBC, UA 6-30 0 - 5 WBC/hpf   RBC / HPF 0-5 0 - 5 RBC/hpf   Bacteria, UA MANY (A) NONE SEEN   Dg Chest 2 View Result Date: 02/05/2016 CLINICAL DATA:  Syncope, unresponsiveness, hypotension. EXAM: CHEST  2 VIEW COMPARISON:  05/18/2015. FINDINGS: Surgical clips are seen in the expected location of the right thyroid. Trachea is midline. Heart size stable. Lungs are clear. No pleural fluid. Degenerative changes are seen in the spine. IMPRESSION: No acute findings. Electronically Signed   By: Lorin Picket  M.D.   On: 02/05/2016 14:38   Ct Head Wo Contrast Result Date: 02/05/2016 CLINICAL DATA:  Syncope.  Found unresponsive. EXAM: CT HEAD WITHOUT CONTRAST TECHNIQUE: Contiguous axial images were obtained from the base of the skull through the vertex without intravenous contrast. COMPARISON:  01/29/2016 FINDINGS: Brain: No evidence of acute infarction, hemorrhage, hydrocephalus, extra-axial collection or mass lesion/mass effect. Prominence of the sulci  and ventricles are identified compatible with brain atrophy. Vascular: No hyperdense vessel or unexpected calcification. Skull: Normal. Negative for fracture or focal lesion. Sinuses/Orbits: No acute finding. Other: None. IMPRESSION: 1. No acute intracranial abnormalities. 2. Brain atrophy. Electronically Signed   By: Kerby Moors M.D.   On: 02/05/2016 15:11    1715:  BP improved while in ED without intervention. +UTI, UC pending; IV rocephin ordered. Dx and testing d/w pt and family.  Questions answered.  Verb understanding, agreeable to admit.  T/C to [facility] Dr. Lorin Mercy, case discussed, including:  HPI, pertinent PM/SHx, VS/PE, dx testing, ED course and treatment:  Agreeable to admit, requests to write temporary orders, obtain tele bed to team APAdmits.     Final Clinical Impressions(s) / ED Diagnoses   Final diagnoses:  None    New Prescriptions New Prescriptions   No medications on file      Francine Graven, DO 02/08/16 1826

## 2016-02-05 NOTE — H&P (Signed)
History and Physical    Suzanne Fritz MWN:[id number] DOB: Jul 24, 1931 DOA: 02/05/2016  PCP: Ardelle Anton, MD Consultants:  Dohmeier - Neurology Patient coming from: home - lives with daughter, who is her Suzanne Fritz; Suzanne Fritz, [phone]  Chief Complaint: "spell"  HPI: Suzanne Fritz is a 82 y.o. female with medical history significant of advanced dementia, HTN, HLD, depression, adenocarcinoma presenting with progressive dementia.  Her daughter is the historian because the patient is minimally able to communicate.  The daughter reports that she was talking to the patient and the patient had a spell and she would not respond at all, just limp.  Admitted 1 week ago for 1 TIA work up and was somewhat similar to today.  Today's episode lasted until EMS arrived, patient was still "out of it" and her BP was very low.  Urinates constantly with urgency.  No dysuria or hematuria.  BP at home is usually well controlled, no highs or lows per family report.  She was started on Plavix last week, otherwise no medication additions.  Was taken off Prilosec and pain medication, last received it last Monday.  Since hospitalization last week, she is much more flat, "she has changed."  Has spells most days now.  Also with c/o neck spasm/pain.   The patient was admitted from 10/2-3/17 for TIA.  She had a negative MRI/Echo/carotid US.  She was discharged on dual antiplatelet therapy.   ED Course: Per Dr. Thurnell Garbe: 6387:  BP improves while in ED without intervention. +UTI, UC pending; IV rocephin ordered. Dx and testing d/w pt and family.  Questions answered.  Verb understanding, agreeable to admit.  T/C to [facility] Dr. Lorin Mercy, case discussed, including:  HPI, pertinent PM/SHx, VS/PE, dx testing, ED course and treatment:  Agreeable to admit, requests to write temporary orders, obtain tele bed to team APAdmits.  Review of Systems: Unable to obtain due to advanced dementia  Ambulatory Status:  Previously able to walk  with walker, but has more difficulty since last admission  Past Medical History:  Diagnosis Date  . Adenocarcinoma ([place])   . Advanced dementia 05/30/2014  . Alzheimer disease   . Anxiety    STRESS AND MENOPAUSAL SYMPTOMS  . Arthritis   . Back pain   . Behavior disorder    REM  . Clinical depression 05/30/2014  . Dementia   . Depression   . GERD (gastroesophageal reflux disease)   . Hearing difficulty    DECREASED HEARING  . Hearing loss   . Hypercholesterolemia   . Hyperlipidemia   . Hypertension   . Hypothyroidism   . Lung cancer ([place]) 04/2010   upper lobe removed   . Memory problem   . Migraine   . Osteoarthritis    RIGHT KNEE  . Peripheral neuropathy ([place])   . Pneumonia 03/2014  . Syncope and collapse 02/2010   HOSPITALIZED    Past Surgical History:  Procedure Laterality Date  . APPENDECTOMY    . BACK SURGERY    . CHOLECYSTECTOMY    . HEMORRHOID SURGERY    . KNEE SURGERY     RIGHT SIDE  . Left upper lobe resection  04/2010  . THYROIDECTOMY    . VAGINAL HYSTERECTOMY      Social History   Social History  . Marital status: Married    Spouse name: frank  . Number of children: 3  . Years of education: 47   Occupational History  . Retired   .  Retired   Social History  Main Topics  . Smoking status: Former Smoker    Quit date: 04/29/1974  . Smokeless tobacco: Never Used  . Alcohol use No  . Drug use: No  . Sexual activity: Not on file   Other Topics Concern  . Not on file   Social History Narrative    She is married, three chill, retired.  Quit smoking 36     years ago.  Does not drink alcohol on a regular basis.    Caffeine 3-4 avg daily.      Allergies  Allergen Reactions  . Codeine     Family History  Problem Relation Age of Onset  . Stroke Mother 67  . Cancer Father 49    Stomach  . Coronary artery disease Brother     2 brothers , but alive  . Migraines Child     Prior to Admission medications   Medication Sig Start Date End Date  Taking? Authorizing Provider  Ascorbic Acid (VITAMIN C WITH ROSE HIPS) 500 MG tablet Take 500 mg by mouth daily.   Yes Historical Provider, MD  aspirin 81 MG EC tablet Take 81 mg by mouth daily.     Yes Historical Provider, MD  Calcium Carbonate (CALCIUM 600 PO) Take 1 tablet by mouth 2 (two) times daily. 1 po daily    Yes Historical Provider, MD  Cholecalciferol (VITAMIN D) 1000 UNITS capsule Take 5,000 Units by mouth daily.    Yes Historical Provider, MD  clopidogrel (PLAVIX) 75 MG tablet Take 1 tablet (75 mg total) by mouth daily. 01/31/16  Yes Orvan Falconer, MD  glucosamine-chondroitin 500-400 MG tablet Take 1 tablet by mouth 2 (two) times daily.    Yes Historical Provider, MD  levothyroxine (SYNTHROID, LEVOTHROID) 100 MCG tablet Take 100 mcg by mouth.    Yes Historical Provider, MD  NAMZARIC 28-10 MG CP24 TAKE 1 CAPSULE BEFORE BREAKFAST 01/02/16  Yes Asencion Partridge Dohmeier, MD  OLANZapine (ZYPREXA) 7.5 MG tablet Take 1 tablet (7.5 mg total) by mouth at bedtime. 12/07/15  Yes Carmen Dohmeier, MD  omeprazole (PRILOSEC) 20 MG capsule Take 20 mg by mouth daily as needed.    Yes Historical Provider, MD  VESICARE 10 MG tablet TAKE ONE TABLET (10 MG TOTAL) BY MOUTH DAILY. 05/26/15  Yes Historical Provider, MD  promethazine (PHENERGAN) 25 MG tablet Take 25 mg by mouth every 6 (six) hours as needed.      Historical Provider, MD    Physical Exam: Vitals:   02/05/16 1720 02/05/16 1730 02/05/16 1800 02/05/16 1937  BP:  159/67  (!) 166/81  Pulse:  64 64 68  Resp:  '15 18 16  '$ Temp: 98.9 F (37.2 C)   98.5 F (36.9 C)  TempSrc: Oral   Oral  SpO2:  96% 97% 98%  Weight:    79.5 kg (175 lb 4.8 oz)  Height:    '5\' 7"'$  (1.702 m)     General: Appears calm and comfortable and is NAD Eyes:  PERRL, EOMI, normal lids, iris ENT:  grossly normal hearing, lips & tongue, mmm Neck:  no LAD, masses or thyromegaly Cardiovascular:  RRR, no m/r/g. No LE edema.  Respiratory:  CTA bilaterally, no w/r/r. Normal respiratory  effort. Abdomen:  soft, ntnd, NABS Skin:  no rash or induration seen on limited exam Musculoskeletal:  grossly normal tone BUE/BLE, good ROM, no bony abnormality Psychiatric:  Very stoic.  Minimal interaction.  Masked facies.  Only occasional attempts to smile or speak.  Not oriented. Neurologic:  Unable  to perform  Labs on Admission: I have personally reviewed following labs and imaging studies  CBC:  Recent Labs Lab 02/05/16 1414  WBC 11.0*  HGB 12.4  HCT 38.3  MCV 91.2  PLT 412   Basic Metabolic Panel:  Recent Labs Lab 02/05/16 1414  NA 138  K 3.8  CL 102  CO2 29  GLUCOSE 122*  BUN 18  CREATININE 1.22*  CALCIUM 9.4   GFR: Estimated Creatinine Clearance: 38.6 mL/min (by C-G formula based on SCr of 1.22 mg/dL (H)). Liver Function Tests: No results for input(s): AST, ALT, ALKPHOS, BILITOT, PROT, ALBUMIN in the last 168 hours. No results for input(s): LIPASE, AMYLASE in the last 168 hours. No results for input(s): AMMONIA in the last 168 hours. Coagulation Profile: No results for input(s): INR, PROTIME in the last 168 hours. Cardiac Enzymes:  Recent Labs Lab 02/05/16 1414  TROPONINI <0.03   BNP (last 3 results) No results for input(s): PROBNP in the last 8760 hours. HbA1C: No results for input(s): HGBA1C in the last 72 hours. CBG:  Recent Labs Lab 01/30/16 1218  GLUCAP 212*   Lipid Profile: No results for input(s): CHOL, HDL, LDLCALC, TRIG, CHOLHDL, LDLDIRECT in the last 72 hours. Thyroid Function Tests: No results for input(s): TSH, T4TOTAL, FREET4, T3FREE, THYROIDAB in the last 72 hours. Anemia Panel: No results for input(s): VITAMINB12, FOLATE, FERRITIN, TIBC, IRON, RETICCTPCT in the last 72 hours. Urine analysis:    Component Value Date/Time   COLORURINE YELLOW 02/05/2016 1308   APPEARANCEUR CLEAR 02/05/2016 1308   LABSPEC <1.005 (L) 02/05/2016 1308   PHURINE 7.0 02/05/2016 1308   GLUCOSEU NEGATIVE 02/05/2016 1308   HGBUR TRACE (A)  02/05/2016 1308   BILIRUBINUR NEGATIVE 02/05/2016 1308   BILIRUBINUR negative 05/13/2013 1718   KETONESUR NEGATIVE 02/05/2016 1308   PROTEINUR NEGATIVE 02/05/2016 1308   UROBILINOGEN negative 05/13/2013 1718   UROBILINOGEN 0.2 09/25/2010 1410   NITRITE NEGATIVE 02/05/2016 1308   LEUKOCYTESUR TRACE (A) 02/05/2016 1308    Creatinine Clearance: Estimated Creatinine Clearance: 38.6 mL/min (by C-G formula based on SCr of 1.22 mg/dL (H)).  Sepsis Labs: '@LABRCNTIP'$ (procalcitonin:4,lacticidven:4) )No results found for this or any previous visit (from the past 240 hour(s)).   Radiological Exams on Admission: Dg Chest 2 View  Result Date: 02/05/2016 CLINICAL DATA:  Syncope, unresponsiveness, hypotension. EXAM: CHEST  2 VIEW COMPARISON:  05/18/2015. FINDINGS: Surgical clips are seen in the expected location of the right thyroid. Trachea is midline. Heart size stable. Lungs are clear. No pleural fluid. Degenerative changes are seen in the spine. IMPRESSION: No acute findings. Electronically Signed   By: Lorin Picket M.D.   On: 02/05/2016 14:38   Ct Head Wo Contrast  Result Date: 02/05/2016 CLINICAL DATA:  Syncope.  Found unresponsive. EXAM: CT HEAD WITHOUT CONTRAST TECHNIQUE: Contiguous axial images were obtained from the base of the skull through the vertex without intravenous contrast. COMPARISON:  01/29/2016 FINDINGS: Brain: No evidence of acute infarction, hemorrhage, hydrocephalus, extra-axial collection or mass lesion/mass effect. Prominence of the sulci and ventricles are identified compatible with brain atrophy. Vascular: No hyperdense vessel or unexpected calcification. Skull: Normal. Negative for fracture or focal lesion. Sinuses/Orbits: No acute finding. Other: None. IMPRESSION: 1. No acute intracranial abnormalities. 2. Brain atrophy. Electronically Signed   By: Kerby Moors M.D.   On: 02/05/2016 15:11    EKG: Independently reviewed.  NSR with rate 68; nonspecific T wave  changes  Assessment/Plan Principal Problem:   Advanced dementia Active Problems:   HTN (hypertension)  UTI (urinary tract infection)   Hypothyroidism   Advanced dementia -The vast majority of this visit was spent counseling the patient's family. Her daughter is her primary caregiver and is exhausted - physically and emotionally.  The patient's husband also lives with them and he does not understand his wife's illness.  The daughter is really struggling with her mother's illness, with guilt about the idea of putting her into a nursing home, and yet with the exhaustion that comes with caring for a patient with dementia at home.  The patient's son and daughter-in-law were present too, and there were some tensions about how they are not offering to help.  We spent about 45 minutes just discussing these issues and working through the associated frustrations. -The "spells" that the patient is having seem to be related to her progressive dementia.  EMS reported that she was profoundly hypotensive when they arrived but this may have been a vagal reaction as the hypotension resolved spontaneously and the patient has actually been hypertensive during her time in the ER. -Based on her terminal illness and poor prognosis, I would not recommend further aggressive evaluation or treatment other than obvious issues like her UTI (see below). -Will request ST/PT/OT evaluations as well as CM (Home Health) and SW (placement) consults in case the family is willing to consider placement for her. -Will continue dual antiplatelet therapy for now, but would consider cessation at some point in the near future.  Bleeding risk is high and benefit is fairly low given the circumstances. -Palliative care consult requested.  This family could use a great deal of support.  The patient is Hospice appropriate.  UTI -Daughter does report urinary frequency and urgency. -It is not clear if these are new issues. -UA is mildly  abnormal with trace LE and many bacteria. -This may be asymptomatic bacteriuria, but in this patient there is little way to know for sure. -Received Rocephin 1 gram IV in the ER.  Will continue Rocephin.  If she stays for 2 more days, her treatment will be complete.  HTN -She is not currently taking medication for this. -Her BP has been quite high in the ER. -That said, she was very hypotensive at home. -For now, would continue to monitor without intervention. -If needed, would likely give low-dose ACE-I.  Hypothyroidism -Check TSH - not available in Epic -Continue Synthroid at current dose for now   DVT prophylaxis: Lovenox Code Status:  DNR - confirmed with family Family Communication: Daughter and son present throughout Disposition Plan: To be determined Consults called: ST/PT/OT, SW/CM, Palliative care  Admission status: It is my clinical opinion that referral for OBSERVATION is reasonable and necessary in this patient based on the above information provided. The aforementioned taken together are felt to place the patient at high risk for further clinical deterioration. However it is anticipated that the patient may be medically stable for discharge from the hospital within 24 to 48 hours.  The majority of this encounter, totaling over 70 minutes, was devoted to counseling the family members who are struggling to cope with their mother's advanced dementia.   Karmen Bongo MD [facility]  If 7PM-7AM, please contact night-coverage [email or web address] Password [email or web address]  02/05/2016, 8:49 PM

## 2016-02-06 ENCOUNTER — Observation Stay (HOSPITAL_COMMUNITY)
Admit: 2016-02-06 | Discharge: 2016-02-06 | Disposition: A | Discharge status: Home / Self Care | Payer: Medicare Other | Attending: Internal Medicine | Admitting: Internal Medicine

## 2016-02-06 DIAGNOSIS — Z85118 Personal history of other malignant neoplasm of bronchus and lung: Secondary | ICD-10-CM | POA: Diagnosis not present

## 2016-02-06 DIAGNOSIS — F0280 Dementia in other diseases classified elsewhere without behavioral disturbance: Secondary | ICD-10-CM | POA: Diagnosis present

## 2016-02-06 DIAGNOSIS — Z7982 Long term (current) use of aspirin: Secondary | ICD-10-CM | POA: Diagnosis not present

## 2016-02-06 DIAGNOSIS — N390 Urinary tract infection, site not specified: Secondary | ICD-10-CM | POA: Diagnosis present

## 2016-02-06 DIAGNOSIS — N342 Other urethritis: Secondary | ICD-10-CM | POA: Diagnosis not present

## 2016-02-06 DIAGNOSIS — K219 Gastro-esophageal reflux disease without esophagitis: Secondary | ICD-10-CM | POA: Diagnosis present

## 2016-02-06 DIAGNOSIS — E860 Dehydration: Secondary | ICD-10-CM | POA: Diagnosis present

## 2016-02-06 DIAGNOSIS — I10 Essential (primary) hypertension: Secondary | ICD-10-CM | POA: Diagnosis present

## 2016-02-06 DIAGNOSIS — G40209 Localization-related (focal) (partial) symptomatic epilepsy and epileptic syndromes with complex partial seizures, not intractable, without status epilepticus: Secondary | ICD-10-CM | POA: Diagnosis present

## 2016-02-06 DIAGNOSIS — Z87891 Personal history of nicotine dependence: Secondary | ICD-10-CM | POA: Diagnosis not present

## 2016-02-06 DIAGNOSIS — E039 Hypothyroidism, unspecified: Secondary | ICD-10-CM

## 2016-02-06 DIAGNOSIS — F0390 Unspecified dementia without behavioral disturbance: Secondary | ICD-10-CM | POA: Diagnosis not present

## 2016-02-06 DIAGNOSIS — G309 Alzheimer's disease, unspecified: Secondary | ICD-10-CM | POA: Diagnosis present

## 2016-02-06 DIAGNOSIS — Z66 Do not resuscitate: Secondary | ICD-10-CM | POA: Diagnosis present

## 2016-02-06 DIAGNOSIS — Z8673 Personal history of transient ischemic attack (TIA), and cerebral infarction without residual deficits: Secondary | ICD-10-CM | POA: Diagnosis not present

## 2016-02-06 DIAGNOSIS — Z79899 Other long term (current) drug therapy: Secondary | ICD-10-CM | POA: Diagnosis not present

## 2016-02-06 DIAGNOSIS — Z7902 Long term (current) use of antithrombotics/antiplatelets: Secondary | ICD-10-CM | POA: Diagnosis not present

## 2016-02-06 DIAGNOSIS — B9620 Unspecified Escherichia coli [E. coli] as the cause of diseases classified elsewhere: Secondary | ICD-10-CM | POA: Diagnosis present

## 2016-02-06 DIAGNOSIS — Z515 Encounter for palliative care: Secondary | ICD-10-CM | POA: Diagnosis present

## 2016-02-06 DIAGNOSIS — H9190 Unspecified hearing loss, unspecified ear: Secondary | ICD-10-CM | POA: Diagnosis present

## 2016-02-06 DIAGNOSIS — E7800 Pure hypercholesterolemia, unspecified: Secondary | ICD-10-CM | POA: Diagnosis present

## 2016-02-06 DIAGNOSIS — E785 Hyperlipidemia, unspecified: Secondary | ICD-10-CM | POA: Diagnosis present

## 2016-02-06 LAB — BASIC METABOLIC PANEL
Anion gap: 6 (ref 5–15)
BUN: 17 mg/dL (ref 6–20)
CO2: 26 mmol/L (ref 22–32)
Calcium: 8.8 mg/dL — ABNORMAL LOW (ref 8.9–10.3)
Chloride: 107 mmol/L (ref 101–111)
Creatinine, Ser: 1.07 mg/dL — ABNORMAL HIGH (ref 0.44–1.00)
GFR, EST AFRICAN AMERICAN: 54 mL/min — AB (ref >60)
GFR, EST NON AFRICAN AMERICAN: 47 mL/min — AB (ref >60)
Glucose, Bld: 116 mg/dL — ABNORMAL HIGH (ref 65–99)
POTASSIUM: 3.9 mmol/L (ref 3.5–5.1)
SODIUM: 139 mmol/L (ref 135–145)

## 2016-02-06 LAB — CBC
HEMATOCRIT: 35.3 % — AB (ref 36.0–46.0)
Hemoglobin: 11.5 g/dL — ABNORMAL LOW (ref 12.0–15.0)
MCH: 29.6 pg (ref 26.0–34.0)
MCHC: 32.6 g/dL (ref 30.0–36.0)
MCV: 91 fL (ref 78.0–100.0)
PLATELETS: 200 10*3/uL (ref 150–400)
RBC: 3.88 MIL/uL (ref 3.87–5.11)
RDW: 13.1 % (ref 11.5–15.5)
WBC: 7.4 10*3/uL (ref 4.0–10.5)

## 2016-02-06 MED ORDER — DONEPEZIL HCL 5 MG PO TABS
10.0000 mg | ORAL_TABLET | Freq: Every day | ORAL | Status: DC
  Administered 2016-02-06 – 2016-02-07 (×2): 10 mg via ORAL
  Filled 2016-02-06 (×2): qty 2

## 2016-02-06 MED ORDER — MEMANTINE HCL ER 28 MG PO CP24
28.0000 mg | ORAL_CAPSULE | Freq: Every day | ORAL | Status: DC
  Administered 2016-02-06 – 2016-02-07 (×2): 28 mg via ORAL
  Filled 2016-02-06 (×4): qty 1

## 2016-02-06 MED ORDER — DEXTROSE 5 % IV SOLN
1.0000 g | INTRAVENOUS | Status: DC
  Administered 2016-02-06: 1 g via INTRAVENOUS
  Filled 2016-02-06 (×2): qty 10

## 2016-02-06 MED ORDER — ASPIRIN EC 81 MG PO TBEC
162.0000 mg | DELAYED_RELEASE_TABLET | Freq: Every day | ORAL | Status: DC
  Administered 2016-02-07: 162 mg via ORAL
  Filled 2016-02-06: qty 2

## 2016-02-06 MED ORDER — LEVETIRACETAM 250 MG PO TABS
250.0000 mg | ORAL_TABLET | Freq: Two times a day (BID) | ORAL | Status: DC
  Administered 2016-02-06 – 2016-02-07 (×2): 250 mg via ORAL
  Filled 2016-02-06 (×2): qty 1

## 2016-02-06 NOTE — Evaluation (Signed)
Speech Language Pathology Evaluation Patient Details Name: Suzanne Fritz MRN: [id number] DOB: 07-10-1931 Today's Date: 02/06/2016 Time: 1530-1601 SLP Time Calculation (min) (ACUTE ONLY): 31 min  Problem List:  Patient Active Problem List   Diagnosis Date Noted  . Syncope 02/05/2016  . UTI (urinary tract infection) 02/05/2016  . Hypothyroidism 02/05/2016  . TIA (transient ischemic attack) 01/29/2016  . Alzheimer's disease 09/12/2014  . Advanced dementia 05/30/2014  . Head revolving around 05/30/2014  . Clinical depression 05/30/2014  . Anemia, iron deficiency 02/14/2014  . Dementia in Alzheimer's disease 09/16/2012  . Bronchogenic lung cancer ([place]) 09/16/2012  . Preop cardiovascular exam 09/12/2010  . HTN (hypertension) 09/12/2010  . Hyperlipidemia 09/12/2010   Past Medical History:  Past Medical History:  Diagnosis Date  . Adenocarcinoma ([place])   . Advanced dementia 05/30/2014  . Alzheimer disease   . Anxiety    STRESS AND MENOPAUSAL SYMPTOMS  . Arthritis   . Back pain   . Behavior disorder    REM  . Clinical depression 05/30/2014  . Dementia   . Depression   . GERD (gastroesophageal reflux disease)   . Hearing difficulty    DECREASED HEARING  . Hearing loss   . Hypercholesterolemia   . Hyperlipidemia   . Hypertension   . Hypothyroidism   . Lung cancer ([place]) 04/2010   upper lobe removed   . Memory problem   . Migraine   . Osteoarthritis    RIGHT KNEE  . Peripheral neuropathy ([place])   . Pneumonia 03/2014  . Syncope and collapse 02/2010   HOSPITALIZED   Past Surgical History:  Past Surgical History:  Procedure Laterality Date  . APPENDECTOMY    . BACK SURGERY    . CHOLECYSTECTOMY    . HEMORRHOID SURGERY    . KNEE SURGERY     RIGHT SIDE  . Left upper lobe resection  04/2010  . THYROIDECTOMY    . VAGINAL HYSTERECTOMY     HPI:  82 y.o. female with medical history significant of advanced dementia, HTN, HLD, depression, adenocarcinoma presenting with  progressive dementia. Her daughter is the historian because the patient is a poor historian. The daughter reports that she was talking to the patient and the patient had a spell and she would not respond at all, just limp. Admitted 1 week ago for 1 TIA work up and was somewhat similar to today. Today's episode lasted until EMS arrived, patient was still "out of it" and her BP was very low. Urinates constantly with urgency. No dysuria or hematuria. BP at home is usually well controlled, no highs or lows per family report. She was started on Plavix last week, otherwise no medication additions. Was taken off Prilosec and pain medication, last received it last Monday. Since hospitalization last week, she is much more flat, "she has changed." Has spells most days now. Also with c/o neck spasm/pain. CT reveals no acute intracranial abnormalities.Dx: UTI   Assessment / Plan / Recommendation Clinical Impression  Pt was evaluated at bedside presenting with advanced dementia. Pt is reported to have "spells" of zoning out that result in the inability to verbally communicate, going completely limp and residual weakness and increased confusion from baseline. Pt's baseline advanced dementia decreases the ability of SLP to assess new impairment from baseline confusion and cognitive impairment. Family reports "she gets worse every day". Pt accurately answered y/n questions but demonstrated general naming (common objects) at 50% accuracy; her speech was fairly articulate and her affect was pleasant. Pt presented  with no noted expressive or receptive aphasia all impairments seemed to be related to general progression of dementia. Family reports during these "spells" Pt would not have been able to answer any questions; pt was not manifesting a "spell" during evaluation. Defer to neurology for further assessment and diagnosis. ST will continue to monitor while in acute setting.          Frequency and Duration min 1  x/week  1 week      SLP Evaluation Cognition  Overall Cognitive Status: History of cognitive impairments - at baseline Arousal/Alertness: Lethargic Orientation Level: Disoriented X4       Comprehension  Auditory Comprehension Overall Auditory Comprehension: Impaired at baseline    Expression Verbal Expression Overall Verbal Expression: Appears within functional limits for tasks assessed Written Expression Dominant Hand: Right   Oral / Motor  Oral Motor/Sensory Function Overall Oral Motor/Sensory Function: Within functional limits Motor Speech Overall Motor Speech: Appears within functional limits for tasks assessed     Makaio Mach H. Roddie Mc, CCC-SLP Speech Language Pathologist       Wende Bushy 02/06/2016, 4:13 PM

## 2016-02-06 NOTE — Care Management (Signed)
Per PT's recommendation pt will have PT added to [facility] services at [place].

## 2016-02-06 NOTE — Progress Notes (Signed)
PROGRESS NOTE    Suzanne Fritz  XBM:[id number] DOB: 25-Nov-1931 DOA: 02/05/2016 PCP: Ardelle Anton, MD    Brief Narrative:  This is an 82 year old female with a history of dementia who was brought to the emergency room after having an episode of unresponsiveness. She's had similar episodes in the past. This was also witnessed during her hospital stay, where she became unresponsive for 5-10 minutes with associated right hand tremor. Within about 10 minutes, patient began responding and was back to her usual state . neurology consultation has been requested. She also has a urinary tract infection and is on Rocephin. Due to her dementia, family is considering hospice services. Palliative care has been consulted.   Assessment & Plan:   Principal Problem:   Advanced dementia Active Problems:   HTN (hypertension)   UTI (urinary tract infection)   Hypothyroidism   1. Possible seizures. Patient is noted to have "spells" that cause her to become unresponsive and was noted to have tremor in right hand. Entire episode lasted 5-67mns. She has similar episodes in the past and was hospitalized last week for the same. Vitals were stable during this episode. EEG done with report pending. Requested neurology input.  2. UTI. Started on rocephin. Follow up urine culture  3. Dehydration. Improved with IV fluids  4. Hypothyroidism. Continue synthroid. TSH normal  5. HTN. Blood pressure has been labile. Continue to follow  6. Advanced dementia. Family is considering hospice services at home, which may be appropriate. Palliative care consulted     DVT prophylaxis: lovenox Code Status: DNR Family Communication: discussed with multiple family members at the bedside Disposition Plan: At this time, family wishes to take the patient home.   Consultants:   Neurology  Procedures:   EEG, report pending  Antimicrobials:   rocephin   Subjective: Patient denies any complaints. Multiple  family members in the room. During my visit, patient has a "spell" where she becomes briefly unresponsive. She is noted to have tremors in her right hand. She becomes very lethargic. Entire episode lasts 5-10 mins, after which patient is drowsy. Within another 10 mins, she is back to baseline.  Objective: Vitals:   02/05/16 1937 02/05/16 2235 02/06/16 0613 02/06/16 1416  BP: (!) 166/81 (!) 160/65 (!) 162/60 (!) 112/42  Pulse: 68 75 61 74  Resp: '16 18 18 20  '$ Temp: 98.5 F (36.9 C) 98.2 F (36.8 C) 98.4 F (36.9 C) 99.1 F (37.3 C)  TempSrc: Oral Oral Oral Oral  SpO2: 98% 95% 99% 97%  Weight: 79.5 kg (175 lb 4.8 oz)     Height: '5\' 7"'$  (1.702 m)       Intake/Output Summary (Last 24 hours) at 02/06/16 1634 Last data filed at 02/06/16 1416  Gross per 24 hour  Intake              790 ml  Output                0 ml  Net              790 ml   Filed Weights   02/05/16 1937  Weight: 79.5 kg (175 lb 4.8 oz)    Examination:  General exam: Appears calm and comfortable  Respiratory system: Clear to auscultation. Respiratory effort normal. Cardiovascular system: S1 & S2 heard, RRR. No JVD, murmurs, rubs, gallops or clicks. No pedal edema. Gastrointestinal system: Abdomen is nondistended, soft and nontender. No organomegaly or masses felt. Normal bowel sounds heard. Central nervous system:  No focal neurological deficits. Extremities: Symmetric 3-4 x 5 power. Skin: No rashes, lesions or ulcers Psychiatry: confused, pleasant.     Data Reviewed: I have personally reviewed following labs and imaging studies  CBC:  Recent Labs Lab 02/05/16 1414 02/06/16 0659  WBC 11.0* 7.4  HGB 12.4 11.5*  HCT 38.3 35.3*  MCV 91.2 91.0  PLT 214 536   Basic Metabolic Panel:  Recent Labs Lab 02/05/16 1414 02/06/16 0659  NA 138 139  K 3.8 3.9  CL 102 107  CO2 29 26  GLUCOSE 122* 116*  BUN 18 17  CREATININE 1.22* 1.07*  CALCIUM 9.4 8.8*   GFR: Estimated Creatinine Clearance: 44 mL/min  (by C-G formula based on SCr of 1.07 mg/dL (H)). Liver Function Tests: No results for input(s): AST, ALT, ALKPHOS, BILITOT, PROT, ALBUMIN in the last 168 hours. No results for input(s): LIPASE, AMYLASE in the last 168 hours. No results for input(s): AMMONIA in the last 168 hours. Coagulation Profile: No results for input(s): INR, PROTIME in the last 168 hours. Cardiac Enzymes:  Recent Labs Lab 02/05/16 1414  TROPONINI <0.03   BNP (last 3 results) No results for input(s): PROBNP in the last 8760 hours. HbA1C: No results for input(s): HGBA1C in the last 72 hours. CBG: No results for input(s): GLUCAP in the last 168 hours. Lipid Profile: No results for input(s): CHOL, HDL, LDLCALC, TRIG, CHOLHDL, LDLDIRECT in the last 72 hours. Thyroid Function Tests:  Recent Labs  02/05/16 1414  TSH 2.976   Anemia Panel: No results for input(s): VITAMINB12, FOLATE, FERRITIN, TIBC, IRON, RETICCTPCT in the last 72 hours. Sepsis Labs: No results for input(s): PROCALCITON, LATICACIDVEN in the last 168 hours.  No results found for this or any previous visit (from the past 240 hour(s)).       Radiology Studies: Dg Chest 2 View  Result Date: 02/05/2016 CLINICAL DATA:  Syncope, unresponsiveness, hypotension. EXAM: CHEST  2 VIEW COMPARISON:  05/18/2015. FINDINGS: Surgical clips are seen in the expected location of the right thyroid. Trachea is midline. Heart size stable. Lungs are clear. No pleural fluid. Degenerative changes are seen in the spine. IMPRESSION: No acute findings. Electronically Signed   By: Lorin Picket M.D.   On: 02/05/2016 14:38   Ct Head Wo Contrast  Result Date: 02/05/2016 CLINICAL DATA:  Syncope.  Found unresponsive. EXAM: CT HEAD WITHOUT CONTRAST TECHNIQUE: Contiguous axial images were obtained from the base of the skull through the vertex without intravenous contrast. COMPARISON:  01/29/2016 FINDINGS: Brain: No evidence of acute infarction, hemorrhage, hydrocephalus,  extra-axial collection or mass lesion/mass effect. Prominence of the sulci and ventricles are identified compatible with brain atrophy. Vascular: No hyperdense vessel or unexpected calcification. Skull: Normal. Negative for fracture or focal lesion. Sinuses/Orbits: No acute finding. Other: None. IMPRESSION: 1. No acute intracranial abnormalities. 2. Brain atrophy. Electronically Signed   By: Kerby Moors M.D.   On: 02/05/2016 15:11        Scheduled Meds: . aspirin EC  81 mg Oral Daily  . cefTRIAXone (ROCEPHIN)  IV  1 g Intravenous Q24H  . clopidogrel  75 mg Oral Daily  . darifenacin  15 mg Oral Daily  . docusate sodium  100 mg Oral BID  . donepezil  10 mg Oral Q breakfast   And  . memantine  28 mg Oral Q breakfast  . enoxaparin (LOVENOX) injection  40 mg Subcutaneous Q24H  . levothyroxine  100 mcg Oral QAC breakfast  . OLANZapine  7.5 mg Oral  QHS  . pantoprazole  40 mg Oral Daily   Continuous Infusions:    LOS: 0 days    Time spent: 60mns    MKathie Dike MD [facility] Pager [phone] If 7PM-7AM, please contact night-coverage [email or web address] Password [email or web address] 02/06/2016, 4:34 PM

## 2016-02-06 NOTE — Procedures (Signed)
  [place] A. [name], MD     [email or web address]           HISTORY: The patient presents with recurrent episodes of unresponsiveness and shaking of the right upper extremity worrisome for partial seizures.  MEDICATIONS: Scheduled Meds: . [START ON 02/07/2016] aspirin EC  162 mg Oral Daily  . cefTRIAXone (ROCEPHIN)  IV  1 g Intravenous Q24H  . clopidogrel  75 mg Oral Daily  . darifenacin  15 mg Oral Daily  . docusate sodium  100 mg Oral BID  . donepezil  10 mg Oral Q breakfast   And  . memantine  28 mg Oral Q breakfast  . enoxaparin (LOVENOX) injection  40 mg Subcutaneous Q24H  . levETIRAcetam  250 mg Oral BID  . levothyroxine  100 mcg Oral QAC breakfast  . OLANZapine  7.5 mg Oral QHS  . pantoprazole  40 mg Oral Daily   Continuous Infusions:  PRN Meds:.acetaminophen **OR** acetaminophen, ondansetron **OR** ondansetron (ZOFRAN) IV  Prior to Admission medications   Medication Sig Start Date End Date Taking? Authorizing Provider  Ascorbic Acid (VITAMIN C WITH ROSE HIPS) 500 MG tablet Take 500 mg by mouth daily.   Yes Historical Provider, MD  aspirin 81 MG EC tablet Take 81 mg by mouth daily.     Yes Historical Provider, MD  Calcium Carbonate (CALCIUM 600 PO) Take 1 tablet by mouth 2 (two) times daily. 1 po daily    Yes Historical Provider, MD  Cholecalciferol (VITAMIN D) 1000 UNITS capsule Take 5,000 Units by mouth daily.    Yes Historical Provider, MD  clopidogrel (PLAVIX) 75 MG tablet Take 1 tablet (75 mg total) by mouth daily. 01/31/16  Yes Orvan Falconer, MD  glucosamine-chondroitin 500-400 MG tablet Take 1 tablet by mouth 2 (two) times daily.    Yes Historical Provider, MD  levothyroxine (SYNTHROID, LEVOTHROID) 100 MCG tablet Take 100 mcg by mouth daily before breakfast.    Yes Historical Provider, MD  NAMZARIC 28-10 MG CP24 TAKE 1 CAPSULE BEFORE BREAKFAST 01/02/16  Yes Asencion Partridge Dohmeier, MD  OLANZapine (ZYPREXA) 7.5 MG tablet Take 1 tablet (7.5 mg total) by mouth  at bedtime. 12/07/15  Yes Carmen Dohmeier, MD  omeprazole (PRILOSEC) 20 MG capsule Take 20 mg by mouth daily as needed (indigestion).    Yes Historical Provider, MD  VESICARE 10 MG tablet TAKE ONE TABLET (10 MG TOTAL) BY MOUTH DAILY. 05/26/15  Yes Historical Provider, MD  promethazine (PHENERGAN) 25 MG tablet Take 25 mg by mouth every 6 (six) hours as needed.      Historical Provider, MD      ANALYSIS: A 16 channel recording using standard 10 20 measurements is conducted for 20 minutes. The background activity is as high as 7 Hz. There is beta activity observed in the frontal areas. Awake and drowsy activities are observed. There are occasional generalized frontocentral maximum delta activity. Photic stimulation and hyperventilation are not conducted. No focal slowing is observed. No epileptiform activity is observed.    IMPRESSION: This recording shows mild global slowing. No epileptiform activities are observed.      Isiaah Cuervo A. [name], M.D.  Diplomate, Tax adviser of Psychiatry and Neurology ( Neurology).

## 2016-02-06 NOTE — Clinical Social Work Note (Signed)
CSW received consult for possible placement. Pt has 24/7 supervision at home and daughter plans for pt to return home. PT recommending home health. CSW will sign off, but can be reconsulted if needed.  Benay Pike, [place]

## 2016-02-06 NOTE — Care Management Obs Status (Signed)
[facility] NOTIFICATION   Patient Details  Name: LAKEVIA PERRIS MRN: [id number] Date of Birth: March 08, 1932   Medicare Observation Status Notification Given:  Yes    Sherald Barge, RN 02/06/2016, 11:30 AM

## 2016-02-06 NOTE — Progress Notes (Signed)
OT Cancellation Note  Patient Details Name: ANSHIKA PETHTEL MRN: [id number] DOB: [birth year]   Cancelled Treatment:     Reason evaluation not completed: Pt level of consciousness. Pt unable to maintain wakeful state for evaluation, able to wake up to answer one to two questions, however unable to keep eyes open or remain engaged. Pt daughter present and reports pt did not go to sleep until about 2:30am. Pt's daughter reports pt has been functioning at her baseline at home regarding ADLs and mobility until she has an "episode" when her BP drops. Will attempt evaluation again at a later time when pt able to remain awake to participate.    [name], OTR/L  [phone] 02/06/2016, 9:30 AM

## 2016-02-06 NOTE — Consult Note (Addendum)
Suzanne Mills A. [name], MD     [email or web address]          Suzanne Fritz is an 82 y.o. female.   ASSESSMENT/PLAN: 1. Recurrent episodes of altered mental status, unresponsiveness and focal right-sided tremors. The semiology seems consistent with complex partial seizures. TIA seems less likely. The patient will be treated empirically with low-dose Keppra. We'll also follow-up EEG. She is unlikely to need dual antiplatelet agents at this time. We'll therefore discontinue all the agents. As she is on omeprazole, we will discontinue the Plavix. The aspirin will be increased to 162 mg daily.   2. Advanced cortical dementia of the Alzheimer's type. Continue with dual treatment of Aricept/Namenda.     The patient 82 year old white female who presents with recurrent episodes of unresponsiveness. History is little confusing as she presented a week ago with similar episode but this time it was associated with drooping of the left facial region and possibly left-sided weakness. She had an extensive workup at that time which was unrevealing for ischemia causing infarct. She was diagnosed as having a TIA in place in dual antiplatelet agents. The daughter who is present with her today reports that she's had about for these spells. The daughter has witnessed but will be spells and reports that they all seem stereotyped. She becomes unresponsive for about 10 minutes. On 2 occasions including the spell she had today she has shaking of the right upper extremity. This spell occurred in the hospital and was witnessed by Dr. Gershon Fritz. He reports that she was unresponsive with head bent down and that she did have some shaking of the right upper extremity. The patient is drowsy today. The family reports that she becomes quite drowsy after the spells. Patient does not report any complaints at this time. The family reports that she has advanced dementia and has been diagnosed for about 6 years. She  requires help in most of her activities of daily living. Family has taken over the finances for the last 6 years. She currently is with one of her daughters.   GENERAL: Drowsy but in no acute distress.  HEENT: Normal  ABDOMEN: soft  EXTREMITIES: No edema   BACK: Normal  SKIN: Normal by inspection.    MENTAL STATUS: She lays in bed with eyes closed. She does her eyes to verbal commands. She does follow commands. She is oriented only to person. No dysarthria is appreciated.  CRANIAL NERVES: Pupils are equal, round and reactive to light and accomodation; extra ocular movements are full, there is no significant nystagmus; visual fields are full; upper and lower facial muscles are normal in strength and symmetric, there is no flattening of the nasolabial folds; tongue is midline; uvula is midline; shoulder elevation is normal.  MOTOR: She moves both sides well. Bulk and tone are unremarkable. Exact strength is unreliable because of difficulties following commands.  COORDINATION: Left finger to nose is normal, right finger to nose is normal, No rest tremor; no intention tremor; no postural tremor; no bradykinesia.  REFLEXES: Deep tendon reflexes are symmetrical and normal. Babinski reflexes are flexor bilaterally.   SENSATION: This appears normal to light touch.     Blood pressure (!) 112/42, pulse 74, temperature 99.1 F (37.3 C), temperature source Oral, resp. rate 20, height '5\' 7"'  (1.702 m), weight 175 lb 4.8 oz (79.5 kg), SpO2 97 %.  Past Medical History:  Diagnosis Date  . Adenocarcinoma ([place])   . Advanced dementia 05/30/2014  . Alzheimer disease   .  Anxiety    STRESS AND MENOPAUSAL SYMPTOMS  . Arthritis   . Back pain   . Behavior disorder    REM  . Clinical depression 05/30/2014  . Dementia   . Depression   . GERD (gastroesophageal reflux disease)   . Hearing difficulty    DECREASED HEARING  . Hearing loss   . Hypercholesterolemia   . Hyperlipidemia   . Hypertension     . Hypothyroidism   . Lung cancer ([place]) 04/2010   upper lobe removed   . Memory problem   . Migraine   . Osteoarthritis    RIGHT KNEE  . Peripheral neuropathy ([place])   . Pneumonia 03/2014  . Syncope and collapse 02/2010   HOSPITALIZED    Past Surgical History:  Procedure Laterality Date  . APPENDECTOMY    . BACK SURGERY    . CHOLECYSTECTOMY    . HEMORRHOID SURGERY    . KNEE SURGERY     RIGHT SIDE  . Left upper lobe resection  04/2010  . THYROIDECTOMY    . VAGINAL HYSTERECTOMY      Family History  Problem Relation Age of Onset  . Stroke Mother 83  . Cancer Father 56    Stomach  . Coronary artery disease Brother     2 brothers , but alive  . Migraines Child     Social History:  reports that she quit smoking about 41 years ago. She has never used smokeless tobacco. She reports that she does not drink alcohol or use drugs.  Allergies:  Allergies  Allergen Reactions  . Codeine     Medications: Prior to Admission medications   Medication Sig Start Date End Date Taking? Authorizing Provider  Ascorbic Acid (VITAMIN C WITH ROSE HIPS) 500 MG tablet Take 500 mg by mouth daily.   Yes Historical Provider, MD  aspirin 81 MG EC tablet Take 81 mg by mouth daily.     Yes Historical Provider, MD  Calcium Carbonate (CALCIUM 600 PO) Take 1 tablet by mouth 2 (two) times daily. 1 po daily    Yes Historical Provider, MD  Cholecalciferol (VITAMIN D) 1000 UNITS capsule Take 5,000 Units by mouth daily.    Yes Historical Provider, MD  clopidogrel (PLAVIX) 75 MG tablet Take 1 tablet (75 mg total) by mouth daily. 01/31/16  Yes Orvan Falconer, MD  glucosamine-chondroitin 500-400 MG tablet Take 1 tablet by mouth 2 (two) times daily.    Yes Historical Provider, MD  levothyroxine (SYNTHROID, LEVOTHROID) 100 MCG tablet Take 100 mcg by mouth daily before breakfast.    Yes Historical Provider, MD  NAMZARIC 28-10 MG CP24 TAKE 1 CAPSULE BEFORE BREAKFAST 01/02/16  Yes Asencion Partridge Dohmeier, MD  OLANZapine (ZYPREXA)  7.5 MG tablet Take 1 tablet (7.5 mg total) by mouth at bedtime. 12/07/15  Yes Carmen Dohmeier, MD  omeprazole (PRILOSEC) 20 MG capsule Take 20 mg by mouth daily as needed (indigestion).    Yes Historical Provider, MD  VESICARE 10 MG tablet TAKE ONE TABLET (10 MG TOTAL) BY MOUTH DAILY. 05/26/15  Yes Historical Provider, MD  promethazine (PHENERGAN) 25 MG tablet Take 25 mg by mouth every 6 (six) hours as needed.      Historical Provider, MD    Scheduled Meds: . aspirin EC  81 mg Oral Daily  . cefTRIAXone (ROCEPHIN)  IV  1 g Intravenous Q24H  . clopidogrel  75 mg Oral Daily  . darifenacin  15 mg Oral Daily  . docusate sodium  100 mg Oral BID  .  donepezil  10 mg Oral Q breakfast   And  . memantine  28 mg Oral Q breakfast  . enoxaparin (LOVENOX) injection  40 mg Subcutaneous Q24H  . levothyroxine  100 mcg Oral QAC breakfast  . OLANZapine  7.5 mg Oral QHS  . pantoprazole  40 mg Oral Daily   Continuous Infusions:  PRN Meds:.acetaminophen **OR** acetaminophen, ondansetron **OR** ondansetron (ZOFRAN) IV     Results for orders placed or performed during the hospital encounter of 02/05/16 (from the past 48 hour(s))  Urinalysis, Routine w reflex microscopic     Status: Abnormal   Collection Time: 02/05/16  1:08 PM  Result Value Ref Range   Color, Urine YELLOW YELLOW   APPearance CLEAR CLEAR   Specific Gravity, Urine <1.005 (L) 1.005 - 1.030   pH 7.0 5.0 - 8.0   Glucose, UA NEGATIVE NEGATIVE mg/dL   Hgb urine dipstick TRACE (A) NEGATIVE   Bilirubin Urine NEGATIVE NEGATIVE   Ketones, ur NEGATIVE NEGATIVE mg/dL   Protein, ur NEGATIVE NEGATIVE mg/dL   Nitrite NEGATIVE NEGATIVE   Leukocytes, UA TRACE (A) NEGATIVE  Urine microscopic-add on     Status: Abnormal   Collection Time: 02/05/16  1:08 PM  Result Value Ref Range   Squamous Epithelial / LPF 0-5 (A) NONE SEEN   WBC, UA 6-30 0 - 5 WBC/hpf   RBC / HPF 0-5 0 - 5 RBC/hpf   Bacteria, UA MANY (A) NONE SEEN  Basic metabolic panel      Status: Abnormal   Collection Time: 02/05/16  2:14 PM  Result Value Ref Range   Sodium 138 135 - 145 mmol/L   Potassium 3.8 3.5 - 5.1 mmol/L   Chloride 102 101 - 111 mmol/L   CO2 29 22 - 32 mmol/L   Glucose, Bld 122 (H) 65 - 99 mg/dL   BUN 18 6 - 20 mg/dL   Creatinine, Ser 1.22 (H) 0.44 - 1.00 mg/dL   Calcium 9.4 8.9 - 10.3 mg/dL   GFR calc non Af Amer 40 (L) >60 mL/min   GFR calc Af Amer 46 (L) >60 mL/min    Comment: (NOTE) The eGFR has been calculated using the CKD EPI equation. This calculation has not been validated in all clinical situations. eGFR's persistently <60 mL/min signify possible Chronic Kidney Disease.    Anion gap 7 5 - 15  CBC     Status: Abnormal   Collection Time: 02/05/16  2:14 PM  Result Value Ref Range   WBC 11.0 (H) 4.0 - 10.5 K/uL   RBC 4.20 3.87 - 5.11 MIL/uL   Hemoglobin 12.4 12.0 - 15.0 g/dL   HCT 38.3 36.0 - 46.0 %   MCV 91.2 78.0 - 100.0 fL   MCH 29.5 26.0 - 34.0 pg   MCHC 32.4 30.0 - 36.0 g/dL   RDW 13.1 11.5 - 15.5 %   Platelets 214 150 - 400 K/uL  Troponin I     Status: None   Collection Time: 02/05/16  2:14 PM  Result Value Ref Range   Troponin I <0.03 <0.03 ng/mL  TSH     Status: None   Collection Time: 02/05/16  2:14 PM  Result Value Ref Range   TSH 2.976 0.350 - 4.500 uIU/mL    Comment: Performed by a 3rd Generation assay with a functional sensitivity of <=0.01 uIU/mL.  Basic metabolic panel     Status: Abnormal   Collection Time: 02/06/16  6:59 AM  Result Value Ref Range   Sodium  139 135 - 145 mmol/L   Potassium 3.9 3.5 - 5.1 mmol/L   Chloride 107 101 - 111 mmol/L   CO2 26 22 - 32 mmol/L   Glucose, Bld 116 (H) 65 - 99 mg/dL   BUN 17 6 - 20 mg/dL   Creatinine, Ser 1.07 (H) 0.44 - 1.00 mg/dL   Calcium 8.8 (L) 8.9 - 10.3 mg/dL   GFR calc non Af Amer 47 (L) >60 mL/min   GFR calc Af Amer 54 (L) >60 mL/min    Comment: (NOTE) The eGFR has been calculated using the CKD EPI equation. This calculation has not been validated in all  clinical situations. eGFR's persistently <60 mL/min signify possible Chronic Kidney Disease.    Anion gap 6 5 - 15  CBC     Status: Abnormal   Collection Time: 02/06/16  6:59 AM  Result Value Ref Range   WBC 7.4 4.0 - 10.5 K/uL   RBC 3.88 3.87 - 5.11 MIL/uL   Hemoglobin 11.5 (L) 12.0 - 15.0 g/dL   HCT 35.3 (L) 36.0 - 46.0 %   MCV 91.0 78.0 - 100.0 fL   MCH 29.6 26.0 - 34.0 pg   MCHC 32.6 30.0 - 36.0 g/dL   RDW 13.1 11.5 - 15.5 %   Platelets 200 150 - 400 K/uL    Studies/Results:    [[[[[[[[[BRAIN MRI/MRA 01-29-16 FINDINGS: MRI HEAD FINDINGS  Brain: Diffusion imaging does not show any acute or subacute infarction. There chronic small-vessel ischemic changes affecting the pons. No cerebellar abnormality. Cerebral hemispheres show generalized atrophy with mild chronic small-vessel change of the white matter, less than often seen in healthy individuals of this age. No cortical or large vessel territory infarction. No mass lesion, hemorrhage, hydrocephalus or extra-axial collection. There is a right parietal convexity arachnoid cyst not of any clinical relevance. No pituitary mass.  Vascular: Major vessels at the base of the brain show flow.  Skull and upper cervical spine: Negative  Sinuses/Orbits: Clear/negative  Other: None significant  MRA HEAD FINDINGS  Both internal carotid arteries are widely patent into the brain. The anterior and middle cerebral vessels are patent without proximal stenosis, aneurysm or vascular malformation. There is mild narrowing of the M1 segment on the right, estimated at 30%.  Both vertebral arteries are patent with the left being dominant. No basilar stenosis. Posterior circulation branch vessels are patent proximally. More distal branch vessels are poorly seen, probably related to technical factors.  IMPRESSION: No acute finding. Essentially normal study for a person of this age. Very minimal chronic small-vessel change of  the hemispheric white matter. Ordinary age related volume loss. No major vessel occlusion or correctable proximal stenosis. Mild narrowing of the distal M1 segment on the right, no more than 30%.]]]]]]]]]]]]]]     CAROTID DOPPLERS normal      HEAD CT Brain: No evidence of acute infarction, hemorrhage, hydrocephalus, extra-axial collection or mass lesion/mass effect. Prominence of the sulci and ventricles are identified compatible with brain atrophy.  Vascular: No hyperdense vessel or unexpected calcification.  Skull: Normal. Negative for fracture or focal lesion.  Sinuses/Orbits: No acute finding.  Other: None.  IMPRESSION: 1. No acute intracranial abnormalities. 2. Brain atrophy.       Kadisha Goodine A. Merlene Fritz, M.D.  Diplomate, Tax adviser of Psychiatry and Neurology ( Neurology). 02/06/2016, 5:53 PM

## 2016-02-06 NOTE — Evaluation (Signed)
Physical Therapy Evaluation Patient Details Name: Suzanne Fritz MRN: [id number] DOB: 09/27/1931 Today's Date: 02/06/2016   History of Present Illness  82 y.o. female with medical history significant of advanced dementia, HTN, HLD, depression, adenocarcinoma presenting with progressive dementia.  Her daughter is the historian because the patient is minimally able to communicate.  The daughter reports that she was talking to the patient and the patient had a spell and she would not respond at all, just limp.  Admitted 1 week ago for 1 TIA work up and was somewhat similar to today.  Today's episode lasted until EMS arrived, patient was still "out of it" and her BP was very low.  Urinates constantly with urgency.  No dysuria or hematuria.  BP at home is usually well controlled, no highs or lows per family report.  She was started on Plavix last week, otherwise no medication additions.  Was taken off Prilosec and pain medication, last received it last Monday.  Since hospitalization last week, she is much more flat, "she has changed."  Has spells most days now.  Also with c/o neck spasm/pain.   Dx: UTI  Clinical Impression  Pt received in bed, dtr present, and pt is agreeable to PT evaluation.  Dtr and family provide 24/7 supervision/assistance for the pt, who requires assistance for all ADL's, but normally mobilizes without any DME.  Pt required Min A for sit<>stand and Min A for ambulation x 49f with RW.  Pt was left sitting up in the chair with dtr present to assist pt with eating lunch.  Around 1354, RApolonio Schneiders RN requested PT to assist her with getting the pt BTB due to pt's decreased level of consciousness.  Pt noted to be slightly slumped fwd in the chair, and she required Max A +2 for SPT chair<>bed with minimal responsiveness from the pt, other than then pt expressing that her neck hurt. Dr. MRoderic Palaupresent during this episode, and recommending Hospice at this time.  Will continue to follow for further  skilled acute PT needs, however d/c disposition still TBD.     Follow Up Recommendations Home health PT;Supervision/Assistance - 24 hour - TBD.     Equipment Recommendations  None recommended by PT    Recommendations for Other Services       Precautions / Restrictions Precautions Precautions: Fall Precaution Comments: No falls, but fall precautions due to dementia. Restrictions Weight Bearing Restrictions: No      Mobility  Bed Mobility Overal bed mobility: Needs Assistance Bed Mobility: Supine to Sit     Supine to sit: Supervision (increased time for pt to scoot to the EOB. )        Transfers Overall transfer level: Needs assistance Equipment used: Rolling walker (2 wheeled) Transfers: Sit to/from Stand Sit to Stand: Min assist (Pt required increased time for problem solving best hand position.  Finally vc's to have hands on bed, and push up from the bed. )            Ambulation/Gait Ambulation/Gait assistance: Min assist Ambulation Distance (Feet): 80 Feet Assistive device: Rolling walker (2 wheeled) Gait Pattern/deviations: Step-to pattern;Decreased stride length;Trunk flexed   Gait velocity interpretation: <1.8 ft/sec, indicative of risk for recurrent falls General Gait Details: Pt demonstrates head down position, and requires multiple vc's to look fwd.  Pt is able to follow commands, but does not maintain head fwd posture.  Pt was able to verbalize the room numbers when asked in order to find his way back to the  room.   Stairs            Wheelchair Mobility    Modified Rankin (Stroke Patients Only)       Balance Overall balance assessment: Needs assistance Sitting-balance support: Bilateral upper extremity supported;Feet supported Sitting balance-Leahy Scale: Good     Standing balance support: Bilateral upper extremity supported Standing balance-Leahy Scale: Fair                               Pertinent Vitals/Pain Pain  Assessment: No/denies pain (But both RN, and pt's dtr state that she has been c/o neck pain. )    Home Living   Living Arrangements: Spouse/significant other (dtr) Available Help at Discharge: Family;Available 24 hours/day   Home Access: Stairs to enter   Entrance Stairs-Number of Steps: 2-3 Home Layout: One level Home Equipment: Walker - 2 wheels;Cane - single point;Bedside commode;Shower seat;Hand held shower head;Hospital bed      Prior Function Level of Independence: Needs assistance   Gait / Transfers Assistance Needed: Pt has walker available, dtr reports she does not use often  ADL's / Homemaking Assistance Needed: Dtr reports pt requires assistance and cuing for sequencing with all ADL tasks, is able to feed self  Comments: Family has pt use the RW if she has vertigo.       Hand Dominance   Dominant Hand: Right    Extremity/Trunk Assessment   Upper Extremity Assessment: Generalized weakness           Lower Extremity Assessment: Generalized weakness         Communication   Communication: HOH  Cognition Arousal/Alertness: Awake/alert Behavior During Therapy: Flat affect Overall Cognitive Status: History of cognitive impairments - at baseline                      General Comments      Exercises     Assessment/Plan    PT Assessment Patient needs continued PT services  PT Problem List Decreased strength;Decreased activity tolerance;Decreased balance;Decreased mobility;Decreased knowledge of use of DME;Decreased safety awareness;Decreased knowledge of precautions          PT Treatment Interventions Gait training;Functional mobility training;Therapeutic activities;Patient/family education;DME instruction;Therapeutic exercise;Balance training    PT Goals (Current goals can be found in the Care Plan section)  Acute Rehab PT Goals Patient Stated Goal: Dtr expressed that she wanted the pt to go home.  PT Goal Formulation: All assessment and  education complete, DC therapy Time For Goal Achievement: 02/13/16 Potential to Achieve Goals: Poor    Frequency Min 3X/week   Barriers to discharge        Co-evaluation               End of Session Equipment Utilized During Treatment: Gait belt Activity Tolerance: Patient tolerated treatment well Patient left: in chair;with call bell/phone within reach;with family/visitor present      Functional Assessment Tool Used: [employer] "6-clicks"  Functional Limitation: Mobility: Walking and moving around Mobility: Walking and Moving Around Current Status [phone]): At least 40 percent but less than 60 percent impaired, limited or restricted Mobility: Walking and Moving Around Goal Status [phone]): At least 20 percent but less than 40 percent impaired, limited or restricted    Time: 1152-1208 PT Time Calculation (min) (ACUTE ONLY): 16 min   Charges:   PT Evaluation $PT Eval Low Complexity: 1 Procedure     PT G Codes:  PT G-Codes **NOT FOR INPATIENT CLASS** Functional Assessment Tool Used: [employer] AM-PAC "6-clicks"  Functional Limitation: Mobility: Walking and moving around Mobility: Walking and Moving Around Current Status [phone]): At least 40 percent but less than 60 percent impaired, limited or restricted Mobility: Walking and Moving Around Goal Status [phone]): At least 20 percent but less than 40 percent impaired, limited or restricted    Beth Charlotte Brafford, PT, DPT X: [phone]

## 2016-02-06 NOTE — Care Management Note (Signed)
Case Management Note  Patient Details  Name: Suzanne Fritz MRN: [id number] Date of Birth: [birth year]  Subjective/Objective:                  Pt admitted with AMS and UTI. Pt is from home, lives with daughter and has 24/7 supervision. Pt is non-verbal this morning, daughter at bedside and Suzanne Fritz (daughter and POA) called on the phone. Per Suzanne Fritz  Pt ambulates with a cane or walker, has a hospital bed, BSC, WC and shower chair. Pt is active with [facility] for nursing and aid services. [facility] is aware of admission. Pt's daughter plans on her returning home. Palliative care has been consulted to discuss [place].   Action/Plan: Will cont to follow for DC planning.   Expected Discharge Date:    02/08/2016              Expected Discharge Plan:  [place]  In-House Referral:  Hospice / Palliative Care  Discharge planning Services  CM Consult  Post Acute Care Choice:  Home Health, Resumption of Svcs/PTA Provider Choice offered to:  [facility] POA / Guardian, Adult Children  DME Arranged:    DME Agency:     HH Arranged:  RN, Nurse's Aide [place] Agency:  [facility]  Status of Service:  In process, will continue to follow  If discussed at Long Length of Stay Meetings, dates discussed:    Additional Comments:  Sherald Barge, RN 02/06/2016, 11:31 AM

## 2016-02-06 NOTE — Progress Notes (Signed)
EEG Completed; Results Pending  

## 2016-02-07 DIAGNOSIS — N342 Other urethritis: Secondary | ICD-10-CM

## 2016-02-07 DIAGNOSIS — F0390 Unspecified dementia without behavioral disturbance: Secondary | ICD-10-CM

## 2016-02-07 DIAGNOSIS — I10 Essential (primary) hypertension: Secondary | ICD-10-CM

## 2016-02-07 LAB — CBC
HCT: 35.2 % — ABNORMAL LOW (ref 36.0–46.0)
HEMOGLOBIN: 11.3 g/dL — AB (ref 12.0–15.0)
MCH: 29.1 pg (ref 26.0–34.0)
MCHC: 32.1 g/dL (ref 30.0–36.0)
MCV: 90.7 fL (ref 78.0–100.0)
Platelets: 201 10*3/uL (ref 150–400)
RBC: 3.88 MIL/uL (ref 3.87–5.11)
RDW: 13 % (ref 11.5–15.5)
WBC: 8.3 10*3/uL (ref 4.0–10.5)

## 2016-02-07 LAB — BASIC METABOLIC PANEL
ANION GAP: 5 (ref 5–15)
BUN: 19 mg/dL (ref 6–20)
CALCIUM: 9.1 mg/dL (ref 8.9–10.3)
CO2: 27 mmol/L (ref 22–32)
Chloride: 107 mmol/L (ref 101–111)
Creatinine, Ser: 1.09 mg/dL — ABNORMAL HIGH (ref 0.44–1.00)
GFR calc Af Amer: 53 mL/min — ABNORMAL LOW (ref >60)
GFR, EST NON AFRICAN AMERICAN: 46 mL/min — AB (ref >60)
GLUCOSE: 109 mg/dL — AB (ref 65–99)
Potassium: 4 mmol/L (ref 3.5–5.1)
Sodium: 139 mmol/L (ref 135–145)

## 2016-02-07 MED ORDER — CEFUROXIME AXETIL 500 MG PO TABS: 500.0000 mg | ORAL_TABLET | Freq: Two times a day (BID) | ORAL | 0 refills | Status: DC

## 2016-02-07 MED ORDER — LEVETIRACETAM 250 MG PO TABS: 250.0000 mg | ORAL_TABLET | Freq: Two times a day (BID) | ORAL | 0 refills | Status: AC

## 2016-02-07 MED ORDER — FENTANYL CITRATE (PF) 100 MCG/2ML IJ SOLN
25.0000 ug | INTRAMUSCULAR | Status: DC | PRN
Start: 2016-02-07 — End: 2016-02-07
  Administered 2016-02-07: 25 ug via INTRAVENOUS
  Filled 2016-02-07: qty 2

## 2016-02-07 MED ORDER — ASPIRIN 81 MG PO TBEC: 162.0000 mg | DELAYED_RELEASE_TABLET | Freq: Every day | ORAL | Status: AC

## 2016-02-07 NOTE — Discharge Summary (Signed)
Physician Discharge Summary  Suzanne Fritz GBT:[id number] DOB: 1932-02-11 DOA: 02/05/2016  PCP: Ardelle Anton, MD  Admit date: 02/05/2016 Discharge date: 02/07/2016  Admitted From: Home Disposition:  home  Recommendations for Outpatient Follow-up:  1. Follow up with PCP in 1-2 weeks  Home Health:Home health RN, aide, PT   Discharge Condition:Stable CODE STATUS:DNR Diet recommendation: Regular   Brief/Interim Summary: 82 year old female with a history of dementia who was brought to the emergency room after having an episode of unresponsiveness. She's had similar episodes in the past. This was also witnessed during her hospital stay, where she became unresponsive for 5-10 minutes with associated right hand tremor. Within about 10 minutes, patient began responding and was back to her usual state . neurology consultation has been requested. She also has a urinary tract infection and is on Rocephin. Due to her dementia, family is considering hospice services  1. Likely seizures. Patient was noted to have "spells" that cause her to become unresponsive and was noted to have tremor in right hand. Entire episode lasted 5-12mns. She has similar episodes in the past and was hospitalized last week for the same. Neurology was consulted. Although patient's EEG was found to be unremarkable, neurology was concerned for possible seizure activity with recommendations for continued twice a day Keppra. Patient remained seizure-free this admission.  2. UTI. Started on empiric rocephin. At time of discharge, patient's urine did grow greater than 100,000 Escherichia coli species. Patient tolerated Rocephin. Patient will complete course with Ceftin by mouth.  3. Dehydration. Improved with IV fluids  4. Hypothyroidism. Continue synthroid. TSH normal  5. HTN. Blood pressure has been labile. Continue to follow  6. Advanced dementia. Family is considering hospice services at home, which may be  appropriate. Palliative care consulted  Discharge Diagnoses:  Principal Problem:   Advanced dementia Active Problems:   HTN (hypertension)   Syncope   UTI (urinary tract infection)   Hypothyroidism    Discharge Instructions     Medication List    STOP taking these medications   clopidogrel 75 MG tablet Commonly known as:  PLAVIX     TAKE these medications   aspirin 81 MG EC tablet Take 2 tablets (162 mg total) by mouth daily. Start taking on:  02/08/2016 What changed:  how much to take   CALCIUM 600 PO Take 1 tablet by mouth 2 (two) times daily. 1 po daily   cefUROXime 500 MG tablet Commonly known as:  CEFTIN Take 1 tablet (500 mg total) by mouth 2 (two) times daily with a meal.   glucosamine-chondroitin 500-400 MG tablet Take 1 tablet by mouth 2 (two) times daily.   levETIRAcetam 250 MG tablet Commonly known as:  KEPPRA Take 1 tablet (250 mg total) by mouth 2 (two) times daily.   levothyroxine 100 MCG tablet Commonly known as:  SYNTHROID, LEVOTHROID Take 100 mcg by mouth daily before breakfast.   NAMZARIC 28-10 MG Cp24 Generic drug:  Memantine HCl-Donepezil HCl TAKE 1 CAPSULE BEFORE BREAKFAST   OLANZapine 7.5 MG tablet Commonly known as:  ZYPREXA Take 1 tablet (7.5 mg total) by mouth at bedtime.   omeprazole 20 MG capsule Commonly known as:  PRILOSEC Take 20 mg by mouth daily as needed (indigestion).   promethazine 25 MG tablet Commonly known as:  PHENERGAN Take 25 mg by mouth every 6 (six) hours as needed.   VESICARE 10 MG tablet Generic drug:  solifenacin TAKE ONE TABLET (10 MG TOTAL) BY MOUTH DAILY.   vitamin C with rose  hips 500 MG tablet Take 500 mg by mouth daily.   Vitamin D 1000 units capsule Take 5,000 Units by mouth daily.       Allergies  Allergen Reactions  . Codeine     Procedures/Studies: Dg Chest 2 View  Result Date: 02/05/2016 CLINICAL DATA:  Syncope, unresponsiveness, hypotension. EXAM: CHEST  2 VIEW COMPARISON:   05/18/2015. FINDINGS: Surgical clips are seen in the expected location of the right thyroid. Trachea is midline. Heart size stable. Lungs are clear. No pleural fluid. Degenerative changes are seen in the spine. IMPRESSION: No acute findings. Electronically Signed   By: Lorin Picket M.D.   On: 02/05/2016 14:38   Ct Head Wo Contrast  Result Date: 02/05/2016 CLINICAL DATA:  Syncope.  Found unresponsive. EXAM: CT HEAD WITHOUT CONTRAST TECHNIQUE: Contiguous axial images were obtained from the base of the skull through the vertex without intravenous contrast. COMPARISON:  01/29/2016 FINDINGS: Brain: No evidence of acute infarction, hemorrhage, hydrocephalus, extra-axial collection or mass lesion/mass effect. Prominence of the sulci and ventricles are identified compatible with brain atrophy. Vascular: No hyperdense vessel or unexpected calcification. Skull: Normal. Negative for fracture or focal lesion. Sinuses/Orbits: No acute finding. Other: None. IMPRESSION: 1. No acute intracranial abnormalities. 2. Brain atrophy. Electronically Signed   By: Kerby Moors M.D.   On: 02/05/2016 15:11   Ct Head Wo Contrast  Result Date: 01/29/2016 CLINICAL DATA:  Left-sided weakness and facial droop. EXAM: CT HEAD WITHOUT CONTRAST TECHNIQUE: Contiguous axial images were obtained from the base of the skull through the vertex without intravenous contrast. COMPARISON:  05/10/2010 FINDINGS: Brain: Slightly progressive cerebral atrophy, ventriculomegaly and periventricular white matter disease. No extra-axial fluid collections are identified. No CT findings for acute hemispheric infarction or intracranial hemorrhage. No mass lesions. The brainstem and cerebellum are normal. Vascular: Stable vascular calcifications. No definite aneurysm or hyperdense vessels. Skull: No skull fracture or bone lesion. Mild hyperostosis frontalis interna. Sinuses/Orbits: The paranasal sinuses and mastoid air cells are clear. The globes are intact.  Other: No scalp lesions or hematoma. IMPRESSION: Slightly progressive age related cerebral atrophy, ventriculomegaly and periventricular white matter disease. No acute intracranial findings or mass lesion. Electronically Signed   By: Marijo Sanes M.D.   On: 01/29/2016 14:19   Mr Jodene Nam Head Wo Contrast  Result Date: 01/29/2016 CLINICAL DATA:  Acute weakness with left-sided facial droop beginning 6 hours ago. EXAM: MRI HEAD WITHOUT CONTRAST MRA HEAD WITHOUT CONTRAST TECHNIQUE: Multiplanar, multiecho pulse sequences of the brain and surrounding structures were obtained without intravenous contrast. Angiographic images of the head were obtained using MRA technique without contrast. COMPARISON:  CT same day FINDINGS: MRI HEAD FINDINGS Brain: Diffusion imaging does not show any acute or subacute infarction. There chronic small-vessel ischemic changes affecting the pons. No cerebellar abnormality. Cerebral hemispheres show generalized atrophy with mild chronic small-vessel change of the white matter, less than often seen in healthy individuals of this age. No cortical or large vessel territory infarction. No mass lesion, hemorrhage, hydrocephalus or extra-axial collection. There is a right parietal convexity arachnoid cyst not of any clinical relevance. No pituitary mass. Vascular: Major vessels at the base of the brain show flow. Skull and upper cervical spine: Negative Sinuses/Orbits: Clear/negative Other: None significant MRA HEAD FINDINGS Both internal carotid arteries are widely patent into the brain. The anterior and middle cerebral vessels are patent without proximal stenosis, aneurysm or vascular malformation. There is mild narrowing of the M1 segment on the right, estimated at 30%. Both vertebral arteries are  patent with the left being dominant. No basilar stenosis. Posterior circulation branch vessels are patent proximally. More distal branch vessels are poorly seen, probably related to technical factors.  IMPRESSION: No acute finding. Essentially normal study for a person of this age. Very minimal chronic small-vessel change of the hemispheric white matter. Ordinary age related volume loss. No major vessel occlusion or correctable proximal stenosis. Mild narrowing of the distal M1 segment on the right, no more than 30%. Electronically Signed   By: Nelson Chimes M.D.   On: 01/29/2016 18:42   Mr Brain Wo Contrast  Result Date: 01/29/2016 CLINICAL DATA:  Acute weakness with left-sided facial droop beginning 6 hours ago. EXAM: MRI HEAD WITHOUT CONTRAST MRA HEAD WITHOUT CONTRAST TECHNIQUE: Multiplanar, multiecho pulse sequences of the brain and surrounding structures were obtained without intravenous contrast. Angiographic images of the head were obtained using MRA technique without contrast. COMPARISON:  CT same day FINDINGS: MRI HEAD FINDINGS Brain: Diffusion imaging does not show any acute or subacute infarction. There chronic small-vessel ischemic changes affecting the pons. No cerebellar abnormality. Cerebral hemispheres show generalized atrophy with mild chronic small-vessel change of the white matter, less than often seen in healthy individuals of this age. No cortical or large vessel territory infarction. No mass lesion, hemorrhage, hydrocephalus or extra-axial collection. There is a right parietal convexity arachnoid cyst not of any clinical relevance. No pituitary mass. Vascular: Major vessels at the base of the brain show flow. Skull and upper cervical spine: Negative Sinuses/Orbits: Clear/negative Other: None significant MRA HEAD FINDINGS Both internal carotid arteries are widely patent into the brain. The anterior and middle cerebral vessels are patent without proximal stenosis, aneurysm or vascular malformation. There is mild narrowing of the M1 segment on the right, estimated at 30%. Both vertebral arteries are patent with the left being dominant. No basilar stenosis. Posterior circulation branch vessels  are patent proximally. More distal branch vessels are poorly seen, probably related to technical factors. IMPRESSION: No acute finding. Essentially normal study for a person of this age. Very minimal chronic small-vessel change of the hemispheric white matter. Ordinary age related volume loss. No major vessel occlusion or correctable proximal stenosis. Mild narrowing of the distal M1 segment on the right, no more than 30%. Electronically Signed   By: Nelson Chimes M.D.   On: 01/29/2016 18:42   US Carotid Bilateral (at [facility] Only)  Result Date: 01/30/2016 CLINICAL DATA:  Left facial droop, ataxia, weakness. History of hypertension, coronary disease, hyperlipidemia. EXAM: BILATERAL CAROTID DUPLEX ULTRASOUND TECHNIQUE: Pearline Cables scale imaging, color Doppler and duplex ultrasound were performed of bilateral carotid and vertebral arteries in the neck. COMPARISON:  01/29/2016 brain MRI FINDINGS: Criteria: Quantification of carotid stenosis is based on velocity parameters that correlate the residual internal carotid diameter with NASCET-based stenosis levels, using the diameter of the distal internal carotid lumen as the denominator for stenosis measurement. The following velocity measurements were obtained: RIGHT ICA:  88/11 cm/sec CCA:  88/41 cm/sec SYSTOLIC ICA/CCA RATIO:  0.9 DIASTOLIC ICA/CCA RATIO:  1.1 ECA:  161 cm/sec LEFT ICA:  113/16 cm/sec CCA:  66/06 cm/sec SYSTOLIC ICA/CCA RATIO:  1.2 DIASTOLIC ICA/CCA RATIO:  1.6 ECA:  177 cm/sec RIGHT CAROTID ARTERY: Mild scattered heterogeneous atherosclerotic plaque formation. No hemodynamically significant right ICA stenosis, velocity elevation, or turbulent flow. Degree of narrowing less than 50%. RIGHT VERTEBRAL ARTERY:  Antegrade LEFT CAROTID ARTERY: Similar scattered mild heterogeneous atherosclerotic plaque formation. No hemodynamically significant left ICA stenosis, velocity elevation, or turbulent flow. LEFT  VERTEBRAL ARTERY:  Antegrade IMPRESSION: Mild  bilateral carotid atherosclerosis. No hemodynamically significant ICA stenosis. Degree of narrowing less than 50% bilaterally. Patent antegrade vertebral flow bilaterally Electronically Signed   By: Jerilynn Mages.  Shick M.D.   On: 01/30/2016 09:50    Subjective: No complaints  Discharge Exam: Vitals:   02/07/16 0452 02/07/16 0855  BP: (!) 151/55 (!) 169/63  Pulse: 63 68  Resp: 15 14  Temp: 98.3 F (36.8 C) 97.5 F (36.4 C)   Vitals:   02/06/16 1416 02/06/16 2100 02/07/16 0452 02/07/16 0855  BP: (!) 112/42 (!) 120/59 (!) 151/55 (!) 169/63  Pulse: 74 76 63 68  Resp: '20 20 15 14  '$ Temp: 99.1 F (37.3 C) 99.1 F (37.3 C) 98.3 F (36.8 C) 97.5 F (36.4 C)  TempSrc: Oral Oral Oral Axillary  SpO2: 97% 96% 95% 98%  Weight:      Height:        General: Pt is alert, awake, not in acute distress Cardiovascular: RRR, S1/S2 +, no rubs, no gallops Respiratory: CTA bilaterally, no wheezing, no rhonchi Abdominal: Soft, NT, ND, bowel sounds + Extremities: no edema, no cyanosis   The results of significant diagnostics from this hospitalization (including imaging, microbiology, ancillary and laboratory) are listed below for reference.     Microbiology: Recent Results (from the past 240 hour(s))  Urine culture     Status: Abnormal (Preliminary result)   Collection Time: 02/05/16  1:42 PM  Result Value Ref Range Status   Specimen Description URINE, RANDOM  Final   Special Requests NONE  Final   Culture >=100,000 COLONIES/mL ESCHERICHIA COLI (A)  Final   Report Status PENDING  Incomplete     Labs: BNP (last 3 results) No results for input(s): BNP in the last 8760 hours. Basic Metabolic Panel:  Recent Labs Lab 02/05/16 1414 02/06/16 0659 02/07/16 0643  NA 138 139 139  K 3.8 3.9 4.0  CL 102 107 107  CO2 '29 26 27  '$ GLUCOSE 122* 116* 109*  BUN '18 17 19  '$ CREATININE 1.22* 1.07* 1.09*  CALCIUM 9.4 8.8* 9.1   Liver Function Tests: No results for input(s): AST, ALT, ALKPHOS, BILITOT,  PROT, ALBUMIN in the last 168 hours. No results for input(s): LIPASE, AMYLASE in the last 168 hours. No results for input(s): AMMONIA in the last 168 hours. CBC:  Recent Labs Lab 02/05/16 1414 02/06/16 0659 02/07/16 0643  WBC 11.0* 7.4 8.3  HGB 12.4 11.5* 11.3*  HCT 38.3 35.3* 35.2*  MCV 91.2 91.0 90.7  PLT 214 200 201   Cardiac Enzymes:  Recent Labs Lab 02/05/16 1414  TROPONINI <0.03   BNP: Invalid input(s): POCBNP CBG: No results for input(s): GLUCAP in the last 168 hours. D-Dimer No results for input(s): DDIMER in the last 72 hours. Hgb A1c No results for input(s): HGBA1C in the last 72 hours. Lipid Profile No results for input(s): CHOL, HDL, LDLCALC, TRIG, CHOLHDL, LDLDIRECT in the last 72 hours. Thyroid function studies  Recent Labs  02/05/16 1414  TSH 2.976   Anemia work up No results for input(s): VITAMINB12, FOLATE, FERRITIN, TIBC, IRON, RETICCTPCT in the last 72 hours. Urinalysis    Component Value Date/Time   COLORURINE YELLOW 02/05/2016 [place] 02/05/2016 1308   LABSPEC <1.005 (L) 02/05/2016 1308   PHURINE 7.0 02/05/2016 1308   GLUCOSEU NEGATIVE 02/05/2016 1308   HGBUR TRACE (A) 02/05/2016 1308   BILIRUBINUR NEGATIVE 02/05/2016 1308   BILIRUBINUR negative 05/13/2013 1718   KETONESUR NEGATIVE  02/05/2016 1308   PROTEINUR NEGATIVE 02/05/2016 1308   UROBILINOGEN negative 05/13/2013 1718   UROBILINOGEN 0.2 09/25/2010 1410   NITRITE NEGATIVE 02/05/2016 1308   LEUKOCYTESUR TRACE (A) 02/05/2016 1308   Sepsis Labs Invalid input(s): PROCALCITONIN,  WBC,  LACTICIDVEN Microbiology Recent Results (from the past 240 hour(s))  Urine culture     Status: Abnormal (Preliminary result)   Collection Time: 02/05/16  1:42 PM  Result Value Ref Range Status   Specimen Description URINE, RANDOM  Final   Special Requests NONE  Final   Culture >=100,000 COLONIES/mL ESCHERICHIA COLI (A)  Final   Report Status PENDING  Incomplete      SIGNED:   Jakaiya Netherland, Orpah Melter, MD  [facility] 02/07/2016, 2:38 PM  If 7PM-7AM, please contact night-coverage [email or web address] Password [email or web address]

## 2016-02-07 NOTE — Care Management Note (Signed)
Case Management Note  Patient Details  Name: Suzanne Fritz MRN: [id number] Date of Birth: May 22, 1931  Expected Discharge Date:      02/07/2016            Expected Discharge Plan:  Home w Hospice Care  In-House Referral:  Hospice / Palliative Care  Discharge planning Services  CM Consult  Post Acute Care Choice:  Resumption of Svcs/PTA Provider, Hospice Choice offered to:  [facility] POA / Guardian, Adult Children  DME Arranged:    DME Agency:     HH Arranged:  RN, Nurse's Aide [place] Agency:  [place]  Status of Service:  Completed, signed off   Additional Comments: Family has made decision to place pt into Hospice care at this time. Children at bedside and have chosen [facility] from list of hospice providers. Referral has been faxed and pt has been accepted. Pt will DC home today. Has no DME needs. Pt family will provide transport. Pat, pt's POA has been unavailable yesterday afternoon and this morning. Order to resume [facility] services also placed in case [facility] is not agreeable to hospice, [facility] may resume services at home. [facility] is aware of Hospice referral and DC today.   Sherald Barge, RN 02/07/2016, 10:23 AM

## 2016-02-07 NOTE — Progress Notes (Signed)
Patient discharged home.  IV removed - WNL.   Reviewed DC instructions and meds with patients daughter - verbalized understanding.  Instructed to follow up with PCP.  No questions at this time.  Assisted off unit via WC in NAD

## 2016-02-08 LAB — URINE CULTURE

## 2016-02-08 NOTE — Telephone Encounter (Signed)
I spoke to Suzanne Fritz, per DPR, and offered her a sooner appt of 02/26/16. Pt's daughter accepted. Pat asked me to cancel the 11/22 appt.

## 2016-02-26 ENCOUNTER — Ambulatory Visit (INDEPENDENT_AMBULATORY_CARE_PROVIDER_SITE_OTHER): Admitting: Neurology

## 2016-02-26 ENCOUNTER — Encounter: Payer: Self-pay | Admitting: Neurology

## 2016-02-26 VITALS — BP 111/63 | HR 64 | Resp 20 | Ht 65.0 in | Wt 174.0 lb

## 2016-02-26 DIAGNOSIS — F0281 Dementia in other diseases classified elsewhere with behavioral disturbance: Secondary | ICD-10-CM

## 2016-02-26 DIAGNOSIS — G301 Alzheimer's disease with late onset: Secondary | ICD-10-CM

## 2016-02-26 DIAGNOSIS — F02818 Dementia in other diseases classified elsewhere, unspecified severity, with other behavioral disturbance: Secondary | ICD-10-CM

## 2016-02-26 MED ORDER — QUETIAPINE FUMARATE 50 MG PO TABS: 50.0000 mg | ORAL_TABLET | Freq: Every day | ORAL | 1 refills | Status: DC
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
## 2016-02-26 NOTE — Patient Instructions (Signed)
Quetiapine tablets What is this medicine? QUETIAPINE (kwe TYE a peen) is an antipsychotic. It is used to treat schizophrenia and bipolar disorder, also known as manic-depression. This medicine may be used for other purposes; ask your health care provider or pharmacist if you have questions. What should I tell my health care provider before I take this medicine? They need to know if you have any of these conditions: -brain tumor or head injury -breast cancer -cataracts -diabetes -difficulty swallowing -heart disease -kidney disease -liver disease -low blood counts, like low white cell, platelet, or red cell counts -low blood pressure or dizziness when standing up -Parkinson's disease -previous heart attack -seizures -suicidal thoughts, plans, or attempt by you or a family member -thyroid disease -an unusual or allergic reaction to quetiapine, other medicines, foods, dyes, or preservatives -pregnant or trying to get pregnant -breast-feeding How should I use this medicine? Take this medicine by mouth. Swallow it with a drink of water. Follow the directions on the prescription label. If it upsets your stomach you can take it with food. Take your medicine at regular intervals. Do not take it more often than directed. Do not stop taking except on the advice of your doctor or health care professional. A special MedGuide will be given to you by the pharmacist with each prescription and refill. Be sure to read this information carefully each time. Talk to your pediatrician regarding the use of this medicine in children. While this drug may be prescribed for children as young as 10 years for selected conditions, precautions do apply. Patients over age 80 years may have a stronger reaction to this medicine and need smaller doses. Overdosage: If you think you have taken too much of this medicine contact a poison control center or emergency room at once. NOTE: This medicine is only for you. Do not  share this medicine with others. What if I miss a dose? If you miss a dose, take it as soon as you can. If it is almost time for your next dose, take only that dose. Do not take double or extra doses. What may interact with this medicine? Do not take this medicine with any of the following medications: -certain medicines for fungal infections like fluconazole, itraconazole, ketoconazole, posaconazole, voriconazole -cisapride -dofetilide -dronedarone -droperidol -grepafloxacin -halofantrine -phenothiazines like chlorpromazine, mesoridazine, thioridazine -pimozide -sparfloxacin -ziprasidone This medicine may also interact with the following medications: -alcohol -antiviral medicines for HIV or AIDS -certain medicines for blood pressure -certain medicines for depression, anxiety, or psychotic disturbances like haloperidol, lorazepam -certain medicines for diabetes -certain medicines for Parkinson's disease -certain medicines for seizures like carbamazepine, phenobarbital, phenytoin -cimetidine -erythromycin -other medicines that prolong the QT interval (cause an abnormal heart rhythm) -rifampin -steroid medicines like prednisone or cortisone This list may not describe all possible interactions. Give your health care provider a list of all the medicines, herbs, non-prescription drugs, or dietary supplements you use. Also tell them if you smoke, drink alcohol, or use illegal drugs. Some items may interact with your medicine. What should I watch for while using this medicine? Visit your doctor or health care professional for regular checks on your progress. It may be several weeks before you see the full effects of this medicine. Your health care provider may suggest that you have your eyes examined prior to starting this medicine, and every 6 months thereafter. If you have been taking this medicine regularly for some time, do not suddenly stop taking it. You must gradually reduce the dose  or  your symptoms may get worse. Ask your doctor or health care professional for advice. Patients and their families should watch out for worsening depression or thoughts of suicide. Also watch out for sudden or severe changes in feelings such as feeling anxious, agitated, panicky, irritable, hostile, aggressive, impulsive, severely restless, overly excited and hyperactive, or not being able to sleep. If this happens, especially at the beginning of antidepressant treatment or after a change in dose, call your health care professional. Dennis Bast may get dizzy or drowsy. Do not drive, use machinery, or do anything that needs mental alertness until you know how this medicine affects you. Do not stand or sit up quickly, especially if you are an older patient. This reduces the risk of dizzy or fainting spells. Alcohol can increase dizziness and drowsiness. Avoid alcoholic drinks. Do not treat yourself for colds, diarrhea or allergies. Ask your doctor or health care professional for advice, some ingredients may increase possible side effects. This medicine can reduce the response of your body to heat or cold. Dress warm in cold weather and stay hydrated in hot weather. If possible, avoid extreme temperatures like saunas, hot tubs, very hot or cold showers, or activities that can cause dehydration such as vigorous exercise. What side effects may I notice from receiving this medicine? Side effects that you should report to your doctor or health care professional as soon as possible: -allergic reactions like skin rash, itching or hives, swelling of the face, lips, or tongue -difficulty swallowing -fast or irregular heartbeat -fever or chills, sore throat -fever with rash, swollen lymph nodes, or swelling of the face -increased hunger or thirst -increased urination -problems with balance, talking, walking -seizures -stiff muscles -suicidal thoughts or other mood changes -uncontrollable head, mouth, neck, arm, or  leg movements -unusually weak or tired Side effects that usually do not require medical attention (report to your doctor or health care professional if they continue or are bothersome): -change in sex drive or performance -constipation -drowsy or dizzy -dry mouth -stomach upset -weight gain This list may not describe all possible side effects. Call your doctor for medical advice about side effects. You may report side effects to FDA at 1-800-FDA-1088. Where should I keep my medicine? Keep out of the reach of children. Store at room temperature between 15 and 30 degrees C (59 and 86 degrees F). Throw away any unused medicine after the expiration date. NOTE: This sheet is a summary. It may not cover all possible information. If you have questions about this medicine, talk to your doctor, pharmacist, or health care provider.    2016, [employer]. (2014-10-18 13:07:35)

## 2016-02-29 ENCOUNTER — Ambulatory Visit: Payer: Self-pay | Admitting: Neurology

## 2016-03-08 ENCOUNTER — Telehealth: Payer: Self-pay | Admitting: *Deleted

## 2016-03-08 DIAGNOSIS — F0281 Dementia in other diseases classified elsewhere with behavioral disturbance: Secondary | ICD-10-CM

## 2016-03-08 DIAGNOSIS — G301 Alzheimer's disease with late onset: Principal | ICD-10-CM

## 2016-03-08 DIAGNOSIS — F02818 Dementia in other diseases classified elsewhere, unspecified severity, with other behavioral disturbance: Secondary | ICD-10-CM

## 2016-03-08 NOTE — Telephone Encounter (Signed)
LMVM for daughter of pt.  Received fax for 90 days supply of seroquel and namzaric.  Wanted to verify request prior to doing.

## 2016-03-11 MED ORDER — MEMANTINE HCL-DONEPEZIL HCL ER 28-10 MG PO CP24: 1.0000 | ORAL_CAPSULE | Freq: Every day | ORAL | 1 refills | Status: DC

## 2016-03-11 MED ORDER — QUETIAPINE FUMARATE 50 MG PO TABS: 50.0000 mg | ORAL_TABLET | Freq: Every day | ORAL | 1 refills | Status: DC

## 2016-03-11 NOTE — Telephone Encounter (Signed)
Spoke to daughter.  [facility] had spoken to daughter about getting 90 days supply of both seroquel and namzaric.  I will renew.

## 2016-03-11 NOTE — Telephone Encounter (Signed)
LMVM for daughter to call me back.  I called [facility] (there system is down).

## 2016-03-20 ENCOUNTER — Ambulatory Visit: Payer: Medicare Other | Admitting: Neurology

## 2016-03-27 ENCOUNTER — Emergency Department (HOSPITAL_COMMUNITY)
Admission: EM | Admit: 2016-03-27 | Discharge: 2016-03-28 | Disposition: A | Discharge status: Home / Self Care | Payer: Medicare Other | Attending: Emergency Medicine | Admitting: Emergency Medicine

## 2016-03-27 ENCOUNTER — Emergency Department (HOSPITAL_COMMUNITY): Payer: Medicare Other

## 2016-03-27 ENCOUNTER — Encounter (HOSPITAL_COMMUNITY): Payer: Self-pay | Admitting: Emergency Medicine

## 2016-03-27 DIAGNOSIS — Z87891 Personal history of nicotine dependence: Secondary | ICD-10-CM | POA: Insufficient documentation

## 2016-03-27 DIAGNOSIS — I10 Essential (primary) hypertension: Secondary | ICD-10-CM | POA: Insufficient documentation

## 2016-03-27 DIAGNOSIS — G309 Alzheimer's disease, unspecified: Secondary | ICD-10-CM | POA: Insufficient documentation

## 2016-03-27 DIAGNOSIS — I639 Cerebral infarction, unspecified: Secondary | ICD-10-CM

## 2016-03-27 DIAGNOSIS — Z5181 Encounter for therapeutic drug level monitoring: Secondary | ICD-10-CM | POA: Insufficient documentation

## 2016-03-27 DIAGNOSIS — R410 Disorientation, unspecified: Secondary | ICD-10-CM | POA: Insufficient documentation

## 2016-03-27 DIAGNOSIS — R4182 Altered mental status, unspecified: Secondary | ICD-10-CM | POA: Diagnosis present

## 2016-03-27 DIAGNOSIS — Z7982 Long term (current) use of aspirin: Secondary | ICD-10-CM | POA: Diagnosis not present

## 2016-03-27 DIAGNOSIS — Z79899 Other long term (current) drug therapy: Secondary | ICD-10-CM | POA: Insufficient documentation

## 2016-03-27 DIAGNOSIS — N390 Urinary tract infection, site not specified: Secondary | ICD-10-CM | POA: Insufficient documentation

## 2016-03-27 HISTORY — DX: Unspecified convulsions: R56.9

## 2016-03-27 LAB — RAPID URINE DRUG SCREEN, HOSP PERFORMED
Amphetamines: NOT DETECTED
Barbiturates: NOT DETECTED
Benzodiazepines: POSITIVE — AB
Cocaine: NOT DETECTED
Opiates: POSITIVE — AB
Tetrahydrocannabinol: NOT DETECTED

## 2016-03-27 LAB — COMPREHENSIVE METABOLIC PANEL WITH GFR
ALT: 15 U/L (ref 14–54)
AST: 26 U/L (ref 15–41)
Albumin: 3.8 g/dL (ref 3.5–5.0)
Alkaline Phosphatase: 79 U/L (ref 38–126)
Anion gap: 8 (ref 5–15)
BUN: 24 mg/dL — ABNORMAL HIGH (ref 6–20)
CO2: 23 mmol/L (ref 22–32)
Calcium: 9.3 mg/dL (ref 8.9–10.3)
Chloride: 104 mmol/L (ref 101–111)
Creatinine, Ser: 1.22 mg/dL — ABNORMAL HIGH (ref 0.44–1.00)
GFR calc Af Amer: 46 mL/min — ABNORMAL LOW
GFR calc non Af Amer: 40 mL/min — ABNORMAL LOW
Glucose, Bld: 174 mg/dL — ABNORMAL HIGH (ref 65–99)
Potassium: 3.4 mmol/L — ABNORMAL LOW (ref 3.5–5.1)
Sodium: 135 mmol/L (ref 135–145)
Total Bilirubin: 0.3 mg/dL (ref 0.3–1.2)
Total Protein: 7.1 g/dL (ref 6.5–8.1)

## 2016-03-27 LAB — URINALYSIS, ROUTINE W REFLEX MICROSCOPIC
Bilirubin Urine: NEGATIVE
Glucose, UA: NEGATIVE mg/dL
Ketones, ur: NEGATIVE mg/dL
Nitrite: POSITIVE — AB
Protein, ur: NEGATIVE mg/dL
Specific Gravity, Urine: 1.025 (ref 1.005–1.030)
pH: 5.5 (ref 5.0–8.0)

## 2016-03-27 LAB — CBC
HCT: 36.7 % (ref 36.0–46.0)
Hemoglobin: 11.7 g/dL — ABNORMAL LOW (ref 12.0–15.0)
MCH: 28.4 pg (ref 26.0–34.0)
MCHC: 31.9 g/dL (ref 30.0–36.0)
MCV: 89.1 fL (ref 78.0–100.0)
Platelets: 238 10*3/uL (ref 150–400)
RBC: 4.12 MIL/uL (ref 3.87–5.11)
RDW: 12.7 % (ref 11.5–15.5)
WBC: 6.7 10*3/uL (ref 4.0–10.5)

## 2016-03-27 LAB — DIFFERENTIAL
Basophils Absolute: 0.1 10*3/uL (ref 0.0–0.1)
Basophils Relative: 1 %
Eosinophils Absolute: 0.1 10*3/uL (ref 0.0–0.7)
Eosinophils Relative: 2 %
Lymphocytes Relative: 25 %
Lymphs Abs: 1.7 10*3/uL (ref 0.7–4.0)
Monocytes Absolute: 0.5 10*3/uL (ref 0.1–1.0)
Monocytes Relative: 8 %
Neutro Abs: 4.3 10*3/uL (ref 1.7–7.7)
Neutrophils Relative %: 64 %

## 2016-03-27 LAB — ETHANOL: Alcohol, Ethyl (B): 5 mg/dL — ABNORMAL HIGH (ref <5)

## 2016-03-27 LAB — PROTIME-INR
INR: 1.01
Prothrombin Time: 13.3 seconds (ref 11.4–15.2)

## 2016-03-27 LAB — URINE MICROSCOPIC-ADD ON

## 2016-03-27 LAB — CBG MONITORING, ED: Glucose-Capillary: 138 mg/dL — ABNORMAL HIGH (ref 65–99)

## 2016-03-27 LAB — APTT: aPTT: 28 seconds (ref 24–36)

## 2016-03-27 MED ORDER — CEPHALEXIN 500 MG PO CAPS
500.0000 mg | ORAL_CAPSULE | Freq: Once | ORAL | Status: AC
  Administered 2016-03-28: 500 mg via ORAL
  Filled 2016-03-27: qty 1

## 2016-03-27 NOTE — Progress Notes (Signed)
CODE STROKE Call time 1850 Beeper 1851                  Note an In-Patient was already on the table and contrast started . 1859  Code stroke patient in the hallway - ems only - no order placed for head ct  when order was placed it was wrong and had to be corrected to code stroke head. 1901 exam started 1902 exam finished 1902 images sent to [facility] exam finished - EMS left with their stretcher, no ER RN in x-ray. Had to call ED to come over and bring stretcher.  1906 exam completed in [place] radiology called spoke with Rhonda/bbj

## 2016-03-27 NOTE — ED Notes (Signed)
Nurse in with pt

## 2016-03-27 NOTE — ED Provider Notes (Signed)
[facility] DEPT Provider Note   CSN: [id number] Arrival date & time: 03/27/16  7902     History   Chief Complaint Chief Complaint  Patient presents with  . Code Stroke    HPI Suzanne Fritz is a 82 y.o. female.  HPI   82 year old female brought in as a code stroke. Apparently EMS was called out for altered mental status. Patient was poorly responsive for family. EMS felt like she had a facial droop on their arrival. Unclear what her exact baseline is, although she probably has very advanced dementia. Patient has no acute complaints but she is not a reliable historian.  Past Medical History:  Diagnosis Date  . Adenocarcinoma ([place])   . Advanced dementia 05/30/2014  . Alzheimer disease   . Anxiety    STRESS AND MENOPAUSAL SYMPTOMS  . Arthritis   . Back pain   . Behavior disorder    REM  . Clinical depression 05/30/2014  . Dementia   . Depression   . GERD (gastroesophageal reflux disease)   . Hearing difficulty    DECREASED HEARING  . Hearing loss   . Hypercholesterolemia   . Hyperlipidemia   . Hypertension   . Hypothyroidism   . Lung cancer ([place]) 04/2010   upper lobe removed   . Memory problem   . Migraine   . Osteoarthritis    RIGHT KNEE  . Peripheral neuropathy ([place])   . Pneumonia 03/2014  . Syncope and collapse 02/2010   HOSPITALIZED    Patient Active Problem List   Diagnosis Date Noted  . Syncope 02/05/2016  . UTI (urinary tract infection) 02/05/2016  . Hypothyroidism 02/05/2016  . TIA (transient ischemic attack) 01/29/2016  . Alzheimer's disease 09/12/2014  . Advanced dementia 05/30/2014  . Head revolving around 05/30/2014  . Clinical depression 05/30/2014  . Anemia, iron deficiency 02/14/2014  . Dementia in Alzheimer's disease 09/16/2012  . Bronchogenic lung cancer ([place]) 09/16/2012  . Preop cardiovascular exam 09/12/2010  . HTN (hypertension) 09/12/2010  . Hyperlipidemia 09/12/2010    Past Surgical History:  Procedure Laterality Date  .  APPENDECTOMY    . BACK SURGERY    . CHOLECYSTECTOMY    . HEMORRHOID SURGERY    . KNEE SURGERY     RIGHT SIDE  . Left upper lobe resection  04/2010  . THYROIDECTOMY    . VAGINAL HYSTERECTOMY      OB History    No data available       Home Medications    Prior to Admission medications   Medication Sig Start Date End Date Taking? Authorizing Provider  Ascorbic Acid (VITAMIN C WITH ROSE HIPS) 500 MG tablet Take 500 mg by mouth daily.    Historical Provider, MD  aspirin EC 81 MG EC tablet Take 2 tablets (162 mg total) by mouth daily. 02/08/16   Donne Hazel, MD  Calcium Carbonate (CALCIUM 600 PO) Take 1 tablet by mouth 2 (two) times daily. 1 po daily     Historical Provider, MD  Cholecalciferol (VITAMIN D) 1000 UNITS capsule Take 5,000 Units by mouth daily.     Historical Provider, MD  glucosamine-chondroitin 500-400 MG tablet Take 1 tablet by mouth 2 (two) times daily.     Historical Provider, MD  HYDROcodone-acetaminophen (NORCO/VICODIN) 5-325 MG tablet  12/26/15   Historical Provider, MD  levETIRAcetam (KEPPRA) 250 MG tablet Take 1 tablet (250 mg total) by mouth 2 (two) times daily. 02/07/16   Donne Hazel, MD  levothyroxine (SYNTHROID, LEVOTHROID) 100  MCG tablet Take 100 mcg by mouth daily before breakfast.     Historical Provider, MD  LORazepam (ATIVAN) 0.5 MG tablet Take 0.5 mg by mouth every 3 (three) hours.    Historical Provider, MD  Memantine HCl-Donepezil HCl [facility]) 28-10 MG CP24 Take 1 capsule by mouth daily before breakfast. 03/11/16   Larey Seat, MD  omeprazole (PRILOSEC) 20 MG capsule Take 20 mg by mouth daily as needed (indigestion).     Historical Provider, MD  promethazine (PHENERGAN) 25 MG tablet Take 25 mg by mouth every 6 (six) hours as needed.      Historical Provider, MD  QUEtiapine (SEROQUEL) 50 MG tablet Take 1 tablet (50 mg total) by mouth at bedtime. 03/11/16   Asencion Partridge Dohmeier, MD  VESICARE 10 MG tablet TAKE ONE TABLET (10 MG TOTAL) BY MOUTH DAILY.  05/26/15   Historical Provider, MD    Family History Family History  Problem Relation Age of Onset  . Stroke Mother 37  . Cancer Father 16    Stomach  . Coronary artery disease Brother     2 brothers , but alive  . Migraines Child     Social History Social History  Substance Use Topics  . Smoking status: Former Smoker    Quit date: 04/29/1974  . Smokeless tobacco: Never Used  . Alcohol use No     Allergies   Codeine   Review of Systems Review of Systems   Level V caveat because of advanced dementia.    Physical Exam Updated Vital Signs There were no vitals taken for this visit.  Physical Exam  Constitutional: She appears well-developed and well-nourished. No distress.  HENT:  Head: Normocephalic and atraumatic.  Eyes: Conjunctivae are normal. Right eye exhibits no discharge. Left eye exhibits no discharge.  Neck: Neck supple.  Cardiovascular: Normal rate, regular rhythm and normal heart sounds.  Exam reveals no gallop and no friction rub.   No murmur heard. Pulmonary/Chest: Effort normal and breath sounds normal. No respiratory distress.  Abdominal: Soft. She exhibits no distension. There is no tenderness.  Musculoskeletal: She exhibits no edema or tenderness.  Neurological: She is alert.  Disoriented to place and time. Does follow some simple commands. No obvious motor deficits. No facial droop.  Skin: Skin is warm and dry.  Psychiatric: She has a normal mood and affect. Her behavior is normal. Thought content normal.  Nursing note and vitals reviewed.    ED Treatments / Results  Labs (all labs ordered are listed, but only abnormal results are displayed) Labs Reviewed  ETHANOL - Abnormal; Notable for the following:       Result Value   Alcohol, Ethyl (B) 5 (*)    All other components within normal limits  CBC - Abnormal; Notable for the following:    Hemoglobin 11.7 (*)    All other components within normal limits  COMPREHENSIVE METABOLIC PANEL -  Abnormal; Notable for the following:    Potassium 3.4 (*)    Glucose, Bld 174 (*)    BUN 24 (*)    Creatinine, Ser 1.22 (*)    GFR calc non Af Amer 40 (*)    GFR calc Af Amer 46 (*)    All other components within normal limits  CBG MONITORING, ED - Abnormal; Notable for the following:    Glucose-Capillary 138 (*)    All other components within normal limits  PROTIME-INR  APTT  DIFFERENTIAL  RAPID URINE DRUG SCREEN, HOSP PERFORMED  URINALYSIS, ROUTINE W REFLEX  MICROSCOPIC (NOT AT [facility])  I-STAT CHEM 8, ED  I-STAT TROPOININ, ED    EKG  EKG Interpretation  Date/Time:  Wednesday March 27 2016 19:17:35 EST Ventricular Rate:  65 PR Interval:    QRS Duration: 76 QT Interval:  418 QTC Calculation: 435 R Axis:   -47 Text Interpretation:  Sinus rhythm Left anterior fascicular block Consider right ventricular hypertrophy Nonspecific T abnormalities, lateral leads Confirmed by ZAMMIT  MD, JOSEPH ([id number]) on 03/28/2016 10:35:23 AM       Radiology No results found.  Procedures Procedures (including critical care time)  Medications Ordered in ED Medications - No data to display   Initial Impression / Assessment and Plan / ED Course  I have reviewed the triage vital signs and the nursing notes.  Pertinent labs & imaging results that were available during my care of the patient were reviewed by me and considered in my medical decision making (see chart for details).  Clinical Course    84yF with change in mental status. Initially made Code Stroke because of possibly facial droop per EMS. None on my exam. Evaluated by neurology and does not feel she needs further emergent work-up.   Final Clinical Impressions(s) / ED Diagnoses   Final diagnoses:  CVA (cerebral vascular accident) [facility])    New Prescriptions New Prescriptions   No medications on file     Virgel Manifold, MD 04/04/16 1643

## 2016-03-27 NOTE — ED Notes (Addendum)
Pt arrived and met at door by dr Wilson Singer and sent to CT. CT aware. Per EMS possible TIA. Had facial droop upon their arrival but is better.

## 2016-03-28 MED ORDER — CEPHALEXIN 500 MG PO CAPS: 500.0000 mg | ORAL_CAPSULE | Freq: Two times a day (BID) | ORAL | 0 refills | Status: AC

## 2016-04-01 ENCOUNTER — Emergency Department (HOSPITAL_COMMUNITY)
Admission: EM | Admit: 2016-04-01 | Discharge: 2016-04-01 | Disposition: A | Discharge status: Home / Self Care | Payer: Medicare Other | Attending: Emergency Medicine | Admitting: Emergency Medicine

## 2016-04-01 ENCOUNTER — Encounter (HOSPITAL_COMMUNITY): Payer: Self-pay | Admitting: Emergency Medicine

## 2016-04-01 DIAGNOSIS — Z85118 Personal history of other malignant neoplasm of bronchus and lung: Secondary | ICD-10-CM | POA: Diagnosis not present

## 2016-04-01 DIAGNOSIS — E039 Hypothyroidism, unspecified: Secondary | ICD-10-CM | POA: Insufficient documentation

## 2016-04-01 DIAGNOSIS — I10 Essential (primary) hypertension: Secondary | ICD-10-CM | POA: Diagnosis not present

## 2016-04-01 DIAGNOSIS — Z79899 Other long term (current) drug therapy: Secondary | ICD-10-CM | POA: Insufficient documentation

## 2016-04-01 DIAGNOSIS — G309 Alzheimer's disease, unspecified: Secondary | ICD-10-CM | POA: Insufficient documentation

## 2016-04-01 DIAGNOSIS — Z87891 Personal history of nicotine dependence: Secondary | ICD-10-CM | POA: Diagnosis not present

## 2016-04-01 DIAGNOSIS — Z7982 Long term (current) use of aspirin: Secondary | ICD-10-CM | POA: Diagnosis not present

## 2016-04-01 DIAGNOSIS — T424X1A Poisoning by benzodiazepines, accidental (unintentional), initial encounter: Secondary | ICD-10-CM | POA: Diagnosis present

## 2016-04-01 DIAGNOSIS — F0281 Dementia in other diseases classified elsewhere with behavioral disturbance: Secondary | ICD-10-CM | POA: Diagnosis not present

## 2016-04-01 LAB — CBC WITH DIFFERENTIAL/PLATELET
BASOS ABS: 0.1 10*3/uL (ref 0.0–0.1)
BASOS PCT: 1 %
EOS ABS: 0.3 10*3/uL (ref 0.0–0.7)
Eosinophils Relative: 4 %
HEMATOCRIT: 34.5 % — AB (ref 36.0–46.0)
HEMOGLOBIN: 11.1 g/dL — AB (ref 12.0–15.0)
Lymphocytes Relative: 30 %
Lymphs Abs: 2 10*3/uL (ref 0.7–4.0)
MCH: 28.8 pg (ref 26.0–34.0)
MCHC: 32.2 g/dL (ref 30.0–36.0)
MCV: 89.4 fL (ref 78.0–100.0)
Monocytes Absolute: 0.9 10*3/uL (ref 0.1–1.0)
Monocytes Relative: 13 %
NEUTROS ABS: 3.5 10*3/uL (ref 1.7–7.7)
NEUTROS PCT: 52 %
Platelets: 205 10*3/uL (ref 150–400)
RBC: 3.86 MIL/uL — AB (ref 3.87–5.11)
RDW: 12.8 % (ref 11.5–15.5)
WBC: 6.7 10*3/uL (ref 4.0–10.5)

## 2016-04-01 LAB — BASIC METABOLIC PANEL
ANION GAP: 7 (ref 5–15)
BUN: 19 mg/dL (ref 6–20)
CHLORIDE: 106 mmol/L (ref 101–111)
CO2: 27 mmol/L (ref 22–32)
CREATININE: 1.13 mg/dL — AB (ref 0.44–1.00)
Calcium: 9.3 mg/dL (ref 8.9–10.3)
GFR calc non Af Amer: 44 mL/min — ABNORMAL LOW (ref >60)
GFR, EST AFRICAN AMERICAN: 51 mL/min — AB (ref >60)
Glucose, Bld: 121 mg/dL — ABNORMAL HIGH (ref 65–99)
Potassium: 3.5 mmol/L (ref 3.5–5.1)
SODIUM: 140 mmol/L (ref 135–145)

## 2016-04-01 NOTE — ED Triage Notes (Signed)
Pt went unresponsive on family at home. She came around but sleepy and lethargic for ems. Pt seen for the same thing on 03/27/16.

## 2016-04-01 NOTE — Discharge Instructions (Signed)
We suspect that Suzanne Fritz suffered side effects of ativan. We are glad she is more alert now. Thank you for taking great care of her and we wish you the very best.

## 2016-04-01 NOTE — ED Notes (Signed)
Patient denies pain and is resting comfortably.  

## 2016-04-01 NOTE — ED Notes (Signed)
Family at bedside. 

## 2016-04-01 NOTE — ED Provider Notes (Signed)
[facility] DEPT Provider Note   CSN: [id number] Arrival date & time: 04/01/16  1916     History   Chief Complaint Chief Complaint  Patient presents with  . Altered Mental Status    HPI Suzanne Fritz is a 82 y.o. female.  HPI  LEVEL 5 CAVEAT FOR DEMENTIA Pt with dementia comes with her family with AMS. Per daughter, pt was agitated after supper, and she gave her the prn ativan 1 mg that was advised by hospice. Around 6 pm pt became sleepy, and then just went "out." Pt wasn't responding to any physical stimuli, EMS was called, and pt responded to ammonia tabs. When the tab was removed, pt was sleeping again.  Pt is arousable. She is oriented to self only. She is able to recognize family in the room. She has no complains. Family reports that it was a normal day until pt became unresponsive.   Past Medical History:  Diagnosis Date  . Adenocarcinoma ([place])   . Advanced dementia 05/30/2014  . Alzheimer disease   . Anxiety    STRESS AND MENOPAUSAL SYMPTOMS  . Arthritis   . Back pain   . Behavior disorder    REM  . Clinical depression 05/30/2014  . Dementia   . Depression   . GERD (gastroesophageal reflux disease)   . Hearing difficulty    DECREASED HEARING  . Hearing loss   . Hypercholesterolemia   . Hyperlipidemia   . Hypertension   . Hypothyroidism   . Lung cancer ([place]) 04/2010   upper lobe removed   . Memory problem   . Migraine   . Osteoarthritis    RIGHT KNEE  . Peripheral neuropathy ([place])   . Pneumonia 03/2014  . Seizures ([place])   . Syncope and collapse 02/2010   HOSPITALIZED    Patient Active Problem List   Diagnosis Date Noted  . Syncope 02/05/2016  . UTI (urinary tract infection) 02/05/2016  . Hypothyroidism 02/05/2016  . TIA (transient ischemic attack) 01/29/2016  . Alzheimer's disease 09/12/2014  . Advanced dementia 05/30/2014  . Head revolving around 05/30/2014  . Clinical depression 05/30/2014  . Anemia, iron deficiency 02/14/2014  .  Dementia in Alzheimer's disease 09/16/2012  . Bronchogenic lung cancer ([place]) 09/16/2012  . Preop cardiovascular exam 09/12/2010  . HTN (hypertension) 09/12/2010  . Hyperlipidemia 09/12/2010    Past Surgical History:  Procedure Laterality Date  . APPENDECTOMY    . BACK SURGERY    . CHOLECYSTECTOMY    . HEMORRHOID SURGERY    . KNEE SURGERY     RIGHT SIDE  . Left upper lobe resection  04/2010  . THYROIDECTOMY    . VAGINAL HYSTERECTOMY      OB History    No data available       Home Medications    Prior to Admission medications   Medication Sig Start Date End Date Taking? Authorizing Provider  Ascorbic Acid (VITAMIN C WITH ROSE HIPS) 500 MG tablet Take 500 mg by mouth daily.    Historical Provider, MD  aspirin EC 81 MG EC tablet Take 2 tablets (162 mg total) by mouth daily. 02/08/16   Donne Hazel, MD  Calcium Carbonate (CALCIUM 600 PO) Take 1 tablet by mouth 2 (two) times daily. 1 po daily     Historical Provider, MD  cephALEXin (KEFLEX) 500 MG capsule Take 1 capsule (500 mg total) by mouth 2 (two) times daily. 03/28/16   Virgel Manifold, MD  Cholecalciferol (VITAMIN D) 1000 UNITS capsule  Take 5,000 Units by mouth daily.     Historical Provider, MD  glucosamine-chondroitin 500-400 MG tablet Take 1 tablet by mouth 2 (two) times daily.     Historical Provider, MD  HYDROcodone-acetaminophen (NORCO/VICODIN) 5-325 MG tablet Take 1 tablet by mouth every 8 (eight) hours.  12/26/15   Historical Provider, MD  levETIRAcetam (KEPPRA) 250 MG tablet Take 1 tablet (250 mg total) by mouth 2 (two) times daily. 02/07/16   Donne Hazel, MD  levothyroxine (SYNTHROID, LEVOTHROID) 100 MCG tablet Take 100 mcg by mouth daily before breakfast.     Historical Provider, MD  LORazepam (ATIVAN) 0.5 MG tablet Take 0.5 mg by mouth every 3 (three) hours.    Historical Provider, MD  Memantine HCl-Donepezil HCl [facility]) 28-10 MG CP24 Take 1 capsule by mouth daily before breakfast. 03/11/16   Larey Seat,  MD  omeprazole (PRILOSEC) 20 MG capsule Take 20 mg by mouth daily as needed (indigestion).     Historical Provider, MD  pantoprazole (PROTONIX) 40 MG tablet Take 40 mg by mouth daily.    Historical Provider, MD  QUEtiapine (SEROQUEL) 50 MG tablet Take 1 tablet (50 mg total) by mouth at bedtime. 03/11/16   Asencion Partridge Dohmeier, MD  VESICARE 10 MG tablet TAKE ONE TABLET (10 MG TOTAL) BY MOUTH DAILY. 05/26/15   Historical Provider, MD    Family History Family History  Problem Relation Age of Onset  . Stroke Mother 73  . Cancer Father 51    Stomach  . Coronary artery disease Brother     2 brothers , but alive  . Migraines Child     Social History Social History  Substance Use Topics  . Smoking status: Former Smoker    Quit date: 04/29/1974  . Smokeless tobacco: Never Used  . Alcohol use No     Allergies   Codeine   Review of Systems Review of Systems  Unable to perform ROS: Dementia     Physical Exam Updated Vital Signs BP (!) 101/50 (BP Location: Left Arm)   Pulse 60   Temp 98.4 F (36.9 C) (Oral)   Ht '5\' 4"'$  (1.626 m)   Wt 175 lb (79.4 kg)   SpO2 100%   BMI 30.04 kg/m   Physical Exam  Constitutional: She appears well-developed.  HENT:  Head: Normocephalic and atraumatic.  Eyes: EOM are normal.  Neck: Normal range of motion. Neck supple.  Cardiovascular: Normal rate.   Pulmonary/Chest: Effort normal.  Abdominal: Bowel sounds are normal.  Neurological:  Somnolent. Oriented x self.  Skin: Skin is warm and dry.  Nursing note and vitals reviewed.    ED Treatments / Results  Labs (all labs ordered are listed, but only abnormal results are displayed) Labs Reviewed  CBC WITH DIFFERENTIAL/PLATELET - Abnormal; Notable for the following:       Result Value   RBC 3.86 (*)    Hemoglobin 11.1 (*)    HCT 34.5 (*)    All other components within normal limits  BASIC METABOLIC PANEL - Abnormal; Notable for the following:    Glucose, Bld 121 (*)    Creatinine, Ser  1.13 (*)    GFR calc non Af Amer 44 (*)    GFR calc Af Amer 51 (*)    All other components within normal limits    EKG  EKG Interpretation  Date/Time:  Monday April 01 2016 21:09:45 EST Ventricular Rate:  69 PR Interval:    QRS Duration: 77 QT Interval:  450 QTC Calculation:  483 R Axis:   36 Text Interpretation:  Sinus rhythm Abnormal T, consider ischemia, lateral leads No acute changes No significant change since last tracing Confirmed by Kathrynn Humble, MD, Lameeka Schleifer [phone]) on 04/01/2016 9:55:26 PM       Radiology No results found.  Procedures Procedures (including critical care time)  Medications Ordered in ED Medications - No data to display   Initial Impression / Assessment and Plan / ED Course  I have reviewed the triage vital signs and the nursing notes.  Pertinent labs & imaging results that were available during my care of the patient were reviewed by me and considered in my medical decision making (see chart for details).  Clinical Course as of Apr 01 2220  Mon Apr 01, 2016  2218 Pt is more alert. Family is happy to take her home now. We will d/c. Ativan precautions discussed.  [AN]    Clinical Course User Index [AN] Varney Biles, MD    Appears to be benzo overdose. Will monitor closely, and d/c when she is more awake. Pt is on antibiotics for UTI, will not check UA. Basic labs ordered.  Final Clinical Impressions(s) / ED Diagnoses   Final diagnoses:  Benzodiazepine overdose, accidental or unintentional, initial encounter    New Prescriptions New Prescriptions   No medications on file     Varney Biles, MD 04/01/16 2226

## 2016-04-16 ENCOUNTER — Emergency Department (HOSPITAL_COMMUNITY)
Admission: EM | Admit: 2016-04-16 | Discharge: 2016-04-17 | Disposition: A | Discharge status: Home / Self Care | Payer: Medicare Other | Attending: Emergency Medicine | Admitting: Emergency Medicine

## 2016-04-16 DIAGNOSIS — Z8673 Personal history of transient ischemic attack (TIA), and cerebral infarction without residual deficits: Secondary | ICD-10-CM | POA: Diagnosis not present

## 2016-04-16 DIAGNOSIS — E039 Hypothyroidism, unspecified: Secondary | ICD-10-CM | POA: Diagnosis not present

## 2016-04-16 DIAGNOSIS — Z7982 Long term (current) use of aspirin: Secondary | ICD-10-CM | POA: Insufficient documentation

## 2016-04-16 DIAGNOSIS — G309 Alzheimer's disease, unspecified: Secondary | ICD-10-CM | POA: Diagnosis not present

## 2016-04-16 DIAGNOSIS — Z87891 Personal history of nicotine dependence: Secondary | ICD-10-CM | POA: Insufficient documentation

## 2016-04-16 DIAGNOSIS — Z85118 Personal history of other malignant neoplasm of bronchus and lung: Secondary | ICD-10-CM | POA: Diagnosis not present

## 2016-04-16 DIAGNOSIS — R404 Transient alteration of awareness: Secondary | ICD-10-CM

## 2016-04-16 DIAGNOSIS — R4182 Altered mental status, unspecified: Secondary | ICD-10-CM | POA: Diagnosis present

## 2016-04-16 DIAGNOSIS — Z79899 Other long term (current) drug therapy: Secondary | ICD-10-CM | POA: Diagnosis not present

## 2016-04-16 NOTE — ED Notes (Signed)
Bed: JS97 Expected date:  Expected time:  Means of arrival:  Comments: EMS [place] 82 yo female/unresponsive-SBP 69-#20 angio/Narcan-now 118 SBP-unknown ingestion

## 2016-04-16 NOTE — ED Triage Notes (Signed)
Pt presents from home with daughter after having a LOC tonight. Unresponsive to noxious stimuli. '4mg'$  narcan given en route after noticing constricted pupils. Pt was responsive to this and is more awake now. SBP initially was 69 but was 118 after narcan. Alert with a hx of dementia.

## 2016-04-17 LAB — CBC WITH DIFFERENTIAL/PLATELET
BASOS ABS: 0 10*3/uL (ref 0.0–0.1)
BASOS PCT: 1 %
EOS ABS: 0.2 10*3/uL (ref 0.0–0.7)
EOS PCT: 3 %
HCT: 33.4 % — ABNORMAL LOW (ref 36.0–46.0)
Hemoglobin: 11 g/dL — ABNORMAL LOW (ref 12.0–15.0)
Lymphocytes Relative: 20 %
Lymphs Abs: 1.4 10*3/uL (ref 0.7–4.0)
MCH: 28.6 pg (ref 26.0–34.0)
MCHC: 32.9 g/dL (ref 30.0–36.0)
MCV: 86.8 fL (ref 78.0–100.0)
MONO ABS: 0.9 10*3/uL (ref 0.1–1.0)
Monocytes Relative: 13 %
Neutro Abs: 4.3 10*3/uL (ref 1.7–7.7)
Neutrophils Relative %: 63 %
PLATELETS: 202 10*3/uL (ref 150–400)
RBC: 3.85 MIL/uL — ABNORMAL LOW (ref 3.87–5.11)
RDW: 12.9 % (ref 11.5–15.5)
WBC: 6.8 10*3/uL (ref 4.0–10.5)

## 2016-04-17 LAB — URINALYSIS, ROUTINE W REFLEX MICROSCOPIC
Bilirubin Urine: NEGATIVE
GLUCOSE, UA: NEGATIVE mg/dL
KETONES UR: NEGATIVE mg/dL
NITRITE: NEGATIVE
PROTEIN: 30 mg/dL — AB
Specific Gravity, Urine: 1.02 (ref 1.005–1.030)
pH: 6 (ref 5.0–8.0)

## 2016-04-17 LAB — BASIC METABOLIC PANEL
Anion gap: 6 (ref 5–15)
BUN: 17 mg/dL (ref 6–20)
CALCIUM: 9.1 mg/dL (ref 8.9–10.3)
CO2: 26 mmol/L (ref 22–32)
Chloride: 111 mmol/L (ref 101–111)
Creatinine, Ser: 1.27 mg/dL — ABNORMAL HIGH (ref 0.44–1.00)
GFR calc Af Amer: 44 mL/min — ABNORMAL LOW (ref >60)
GFR, EST NON AFRICAN AMERICAN: 38 mL/min — AB (ref >60)
GLUCOSE: 113 mg/dL — AB (ref 65–99)
Potassium: 3.7 mmol/L (ref 3.5–5.1)
SODIUM: 143 mmol/L (ref 135–145)

## 2016-04-17 LAB — RAPID URINE DRUG SCREEN, HOSP PERFORMED
AMPHETAMINES: NOT DETECTED
Barbiturates: NOT DETECTED
Benzodiazepines: NOT DETECTED
COCAINE: NOT DETECTED
OPIATES: POSITIVE — AB
Tetrahydrocannabinol: NOT DETECTED

## 2016-04-17 LAB — SALICYLATE LEVEL

## 2016-04-17 LAB — ETHANOL

## 2016-04-17 LAB — ACETAMINOPHEN LEVEL

## 2016-04-17 NOTE — Discharge Instructions (Signed)

## 2016-04-17 NOTE — ED Provider Notes (Signed)
[facility] DEPT Provider Note   CSN: [id number] Arrival date & time: 04/16/16  2332   By signing my name below, I, Eunice Blase, attest that this documentation has been prepared under the direction and in the presence of Ripley Fraise, MD. Electronically signed, Eunice Blase, ED Scribe. 04/17/16. 12:01 AM.   History   Chief Complaint Chief Complaint  Patient presents with  . Unresponsive  LEVEL 5 CAVEAT FOR ALTERED MENTAL STATUS  The history is provided by the EMS personnel and medical records. No language interpreter was used.  Altered Mental Status   This is a recurrent problem. The current episode started less than 1 hour ago. The problem has been gradually improving. Associated symptoms include confusion and unresponsiveness. Pertinent negatives include no violence. Her past medical history is significant for hypertension, depression and dementia.    HPI Comments: Suzanne Fritz is a 82 y.o. female brought in by EMS who presents to the Emergency Department s/p a possible fall. Per nurse, th pt's daughter exclaimed "I didn't give her too much pain medication" unprompted when EMS arrived to the scene. Nurse further states EMS was called for fall, but they noted AMS upon arrival. Pt denies headache, chest pain or abdominal pain.  Past Medical History:  Diagnosis Date  . Adenocarcinoma ([place])   . Advanced dementia 05/30/2014  . Alzheimer disease   . Anxiety    STRESS AND MENOPAUSAL SYMPTOMS  . Arthritis   . Back pain   . Behavior disorder    REM  . Clinical depression 05/30/2014  . Dementia   . Depression   . GERD (gastroesophageal reflux disease)   . Hearing difficulty    DECREASED HEARING  . Hearing loss   . Hypercholesterolemia   . Hyperlipidemia   . Hypertension   . Hypothyroidism   . Lung cancer ([place]) 04/2010   upper lobe removed   . Memory problem   . Migraine   . Osteoarthritis    RIGHT KNEE  . Peripheral neuropathy ([place])   . Pneumonia 03/2014  .  Seizures ([place])   . Syncope and collapse 02/2010   HOSPITALIZED    Patient Active Problem List   Diagnosis Date Noted  . Syncope 02/05/2016  . UTI (urinary tract infection) 02/05/2016  . Hypothyroidism 02/05/2016  . TIA (transient ischemic attack) 01/29/2016  . Alzheimer's disease 09/12/2014  . Advanced dementia 05/30/2014  . Head revolving around 05/30/2014  . Clinical depression 05/30/2014  . Anemia, iron deficiency 02/14/2014  . Dementia in Alzheimer's disease 09/16/2012  . Bronchogenic lung cancer ([place]) 09/16/2012  . Preop cardiovascular exam 09/12/2010  . HTN (hypertension) 09/12/2010  . Hyperlipidemia 09/12/2010    Past Surgical History:  Procedure Laterality Date  . APPENDECTOMY    . BACK SURGERY    . CHOLECYSTECTOMY    . HEMORRHOID SURGERY    . KNEE SURGERY     RIGHT SIDE  . Left upper lobe resection  04/2010  . THYROIDECTOMY    . VAGINAL HYSTERECTOMY      OB History    No data available       Home Medications    Prior to Admission medications   Medication Sig Start Date End Date Taking? Authorizing Provider  Ascorbic Acid (VITAMIN C WITH ROSE HIPS) 500 MG tablet Take 500 mg by mouth daily.    Historical Provider, MD  aspirin EC 81 MG EC tablet Take 2 tablets (162 mg total) by mouth daily. 02/08/16   Donne Hazel, MD  Calcium  Carbonate (CALCIUM 600 PO) Take 1 tablet by mouth 2 (two) times daily. 1 po daily     Historical Provider, MD  cephALEXin (KEFLEX) 500 MG capsule Take 1 capsule (500 mg total) by mouth 2 (two) times daily. 03/28/16   Virgel Manifold, MD  Cholecalciferol (VITAMIN D) 1000 UNITS capsule Take 5,000 Units by mouth daily.     Historical Provider, MD  glucosamine-chondroitin 500-400 MG tablet Take 1 tablet by mouth 2 (two) times daily.     Historical Provider, MD  HYDROcodone-acetaminophen (NORCO/VICODIN) 5-325 MG tablet Take 1 tablet by mouth every 8 (eight) hours.  12/26/15   Historical Provider, MD  levETIRAcetam (KEPPRA) 250 MG tablet Take  1 tablet (250 mg total) by mouth 2 (two) times daily. 02/07/16   Donne Hazel, MD  levothyroxine (SYNTHROID, LEVOTHROID) 100 MCG tablet Take 100 mcg by mouth daily before breakfast.     Historical Provider, MD  LORazepam (ATIVAN) 0.5 MG tablet Take 0.5 mg by mouth every 3 (three) hours.    Historical Provider, MD  Memantine HCl-Donepezil HCl [facility]) 28-10 MG CP24 Take 1 capsule by mouth daily before breakfast. 03/11/16   Larey Seat, MD  omeprazole (PRILOSEC) 20 MG capsule Take 20 mg by mouth daily as needed (indigestion).     Historical Provider, MD  pantoprazole (PROTONIX) 40 MG tablet Take 40 mg by mouth daily.    Historical Provider, MD  QUEtiapine (SEROQUEL) 50 MG tablet Take 1 tablet (50 mg total) by mouth at bedtime. 03/11/16   Asencion Partridge Dohmeier, MD  VESICARE 10 MG tablet TAKE ONE TABLET (10 MG TOTAL) BY MOUTH DAILY. 05/26/15   Historical Provider, MD    Family History Family History  Problem Relation Age of Onset  . Stroke Mother 61  . Cancer Father 39    Stomach  . Coronary artery disease Brother     2 brothers , but alive  . Migraines Child     Social History Social History  Substance Use Topics  . Smoking status: Former Smoker    Quit date: 04/29/1974  . Smokeless tobacco: Never Used  . Alcohol use No     Allergies   Codeine   Review of Systems Review of Systems  Unable to perform ROS: Mental status change  Constitutional: Negative for fever.  Cardiovascular: Negative for chest pain.  Gastrointestinal: Negative for abdominal pain.  Neurological: Negative for headaches.  Psychiatric/Behavioral: Positive for confusion.     Physical Exam Updated Vital Signs BP 156/65 (BP Location: Right Arm)   Pulse 64   Temp 97.6 F (36.4 C) (Oral)   Resp 17   SpO2 95%   Physical Exam CONSTITUTIONAL: Elderly and frail. HEAD: Normocephalic/atraumatic EYES: EOMI/PERRL ENMT: Mucous membranes moist NECK: supple no meningeal signs SPINE/BACK:entire spine  nontender CV: S1/S2 noted, no murmurs/rubs/gallops noted LUNGS: Lungs are clear to auscultation bilaterally, no apparent distress ABDOMEN: soft, nontender, no rebound or guarding, bowel sounds noted throughout abdomen GU:no cva tenderness NEURO: Pt is awake/alert/confused, moves all extremitiesx4.  No facial droop.   EXTREMITIES: pulses normal/equal, full ROM, no deformities or tenderness noted. SKIN: warm, color normal   ED Treatments / Results  DIAGNOSTIC STUDIES: Oxygen Saturation is 96% on RA, adequate by my interpretation.    COORDINATION OF CARE: 12:11 AM Discussed treatment plan with pt at bedside and pt agreed to plan.  Labs (all labs ordered are listed, but only abnormal results are displayed) Labs Reviewed  RAPID URINE DRUG SCREEN, HOSP PERFORMED - Abnormal; Notable for the following:  Result Value   Opiates POSITIVE (*)    All other components within normal limits  URINALYSIS, ROUTINE W REFLEX MICROSCOPIC - Abnormal; Notable for the following:    APPearance HAZY (*)    Hgb urine dipstick SMALL (*)    Protein, ur 30 (*)    Leukocytes, UA MODERATE (*)    Bacteria, UA RARE (*)    Squamous Epithelial / LPF 0-5 (*)    All other components within normal limits  BASIC METABOLIC PANEL - Abnormal; Notable for the following:    Glucose, Bld 113 (*)    Creatinine, Ser 1.27 (*)    GFR calc non Af Amer 38 (*)    GFR calc Af Amer 44 (*)    All other components within normal limits  CBC WITH DIFFERENTIAL/PLATELET - Abnormal; Notable for the following:    RBC 3.85 (*)    Hemoglobin 11.0 (*)    HCT 33.4 (*)    All other components within normal limits  ACETAMINOPHEN LEVEL - Abnormal; Notable for the following:    Acetaminophen (Tylenol), Serum <10 (*)    All other components within normal limits  ETHANOL  SALICYLATE LEVEL    EKG  EKG Interpretation  Date/Time:  Tuesday April 16 2016 23:39:10 EST Ventricular Rate:  65 PR Interval:    QRS Duration: 78 QT  Interval:  444 QTC Calculation: 462 R Axis:   22 Text Interpretation:  Sinus rhythm Nonspecific T abnormalities, lateral leads No significant change since last tracing Confirmed by Christy Gentles  MD, [facility] First ([id number]) on 04/17/2016 1:25:24 AM       Radiology No results found.  Procedures Procedures (including critical care time)  Medications Ordered in ED Medications - No data to display   Initial Impression / Assessment and Plan / ED Course  I have reviewed the triage vital signs and the nursing notes.  Pertinent labs results that were available during my care of the patient were reviewed by me and considered in my medical decision making (see chart for details).  Clinical Course     1:33 AM Pt awake/alert but confused Daughter reports that patient did not take extra pain meds and she does not administer any meds by herself She had an issue recently with ativan, but this time she woke up with narcan, likely due to pain meds.  Narcotic database reviewed and considered in decision making Will continue to monitor patient Apparently pt on hospice but daughter is unsure why she is on hospice 4:01 AM PT AT [facility] "PASS OUT" BUT NO SEIZURE ACTIVITY.  NO SIGNIFICANT TRAUMA REPORTED DAUGHTER REPORTS SHE HAS NOT HAD PAIN MEDS IN PAST 24 HOURS, HOWEVER SHE IS ON MULTIPLE SEDATING MEDS SHE WILL STOP PAIN MEDS FOR NOW AND CALL HOSPICE NURSE/PHYSICIAN LATER THIS MORNING PT WOULD LIKE TO GO HOME SHE IS AT [facility]INE AND APPEARS WELL CARED FOR.    Final Clinical Impressions(s) / ED Diagnoses   Final diagnoses:  Transient alteration of awareness    New Prescriptions New Prescriptions   No medications on file  I personally performed the services described in this documentation, which was scribed in my presence. The recorded information has been reviewed and is accurate.        Ripley Fraise, MD 04/17/16 [phone]

## 2016-04-17 NOTE — ED Notes (Signed)
Pt ambulated in hallway with walker without assistance. Pt was steady on her feet.

## 2016-05-13 ENCOUNTER — Ambulatory Visit: Payer: Medicare Other | Admitting: Neurology

## 2016-06-26 ENCOUNTER — Telehealth: Payer: Self-pay | Admitting: Neurology

## 2016-06-26 DIAGNOSIS — G309 Alzheimer's disease, unspecified: Principal | ICD-10-CM

## 2016-06-26 DIAGNOSIS — F0280 Dementia in other diseases classified elsewhere without behavioral disturbance: Secondary | ICD-10-CM

## 2016-06-26 DIAGNOSIS — F0390 Unspecified dementia without behavioral disturbance: Secondary | ICD-10-CM

## 2016-06-26 DIAGNOSIS — F03C0 Unspecified dementia, severe, without behavioral disturbance, psychotic disturbance, mood disturbance, and anxiety: Secondary | ICD-10-CM

## 2016-06-26 NOTE — Telephone Encounter (Signed)
Suzanne Fritz, patient's daughter is calling. She needs to discuss Hospice for the patient and also medication the patient takes for sleep. She does not know the name of sleep medication.

## 2016-06-26 NOTE — Telephone Encounter (Signed)
I spoke to daughter. Mardene Celeste requests that you call her back [phone].  She would like to discuss getting patient into Hospice in [place].  Mardene Celeste asks to also discuss patient's medications to help her sleep. PCP recommended that patient come off of Seroquel. Says that Seroquel '100mg'$  works ok, but there are times that patient will not sleep for a couple of days.  Daughter sounded tearful.

## 2016-06-26 NOTE — Addendum Note (Signed)
Addended by: Laurence Spates on: 06/26/2016 05:31 PM   Modules accepted: Orders

## 2016-06-26 NOTE — Telephone Encounter (Signed)
Can on;ly walk 50-60 feet, needs help dressing, bathing and feeding. She is living way in the past. Need home health Evaluation.  Had planned face to face tomorrow, but is not ale to get into family car.

## 2016-06-27 ENCOUNTER — Telehealth: Payer: Self-pay | Admitting: Neurology

## 2016-06-27 ENCOUNTER — Ambulatory Visit: Payer: PRIVATE HEALTH INSURANCE | Admitting: Neurology

## 2016-06-27 NOTE — Telephone Encounter (Signed)
Home health referral placed

## 2016-06-27 NOTE — Telephone Encounter (Signed)
LM for Pat.  I stated that patient needs face to face visit prior to referral to home health.  Patient either needs to come in and see Dohmeier or NP, or if patient has had a OV with PCP in the last 30 days the PCP can make the referral to home health.  I left call back number for Pat to call back and discuss further.

## 2016-06-27 NOTE — Telephone Encounter (Signed)
Per Home Health Patient  Needs a follow up apt. Family might have to arrange EMS to bring her in . Patient has not been seen since October for insurance to cover.

## 2016-06-27 NOTE — Telephone Encounter (Signed)
FYI

## 2016-08-11 ENCOUNTER — Emergency Department (HOSPITAL_COMMUNITY)
Admission: EM | Admit: 2016-08-11 | Discharge: 2016-08-11 | Disposition: A | Discharge status: Home / Self Care | Payer: Medicare Other | Attending: Emergency Medicine | Admitting: Emergency Medicine

## 2016-08-11 ENCOUNTER — Emergency Department (HOSPITAL_COMMUNITY): Payer: Medicare Other

## 2016-08-11 ENCOUNTER — Encounter (HOSPITAL_COMMUNITY): Payer: Self-pay | Admitting: Emergency Medicine

## 2016-08-11 DIAGNOSIS — Z87891 Personal history of nicotine dependence: Secondary | ICD-10-CM | POA: Insufficient documentation

## 2016-08-11 DIAGNOSIS — Z7982 Long term (current) use of aspirin: Secondary | ICD-10-CM | POA: Diagnosis not present

## 2016-08-11 DIAGNOSIS — I10 Essential (primary) hypertension: Secondary | ICD-10-CM | POA: Diagnosis not present

## 2016-08-11 DIAGNOSIS — Z79899 Other long term (current) drug therapy: Secondary | ICD-10-CM | POA: Insufficient documentation

## 2016-08-11 DIAGNOSIS — Z8673 Personal history of transient ischemic attack (TIA), and cerebral infarction without residual deficits: Secondary | ICD-10-CM | POA: Insufficient documentation

## 2016-08-11 DIAGNOSIS — R55 Syncope and collapse: Secondary | ICD-10-CM | POA: Insufficient documentation

## 2016-08-11 DIAGNOSIS — Z85118 Personal history of other malignant neoplasm of bronchus and lung: Secondary | ICD-10-CM | POA: Diagnosis not present

## 2016-08-11 DIAGNOSIS — E039 Hypothyroidism, unspecified: Secondary | ICD-10-CM | POA: Diagnosis not present

## 2016-08-11 LAB — CBG MONITORING, ED: GLUCOSE-CAPILLARY: 123 mg/dL — AB (ref 65–99)

## 2016-08-11 LAB — URINALYSIS, ROUTINE W REFLEX MICROSCOPIC
Bilirubin Urine: NEGATIVE
GLUCOSE, UA: 50 mg/dL — AB
Hgb urine dipstick: NEGATIVE
KETONES UR: NEGATIVE mg/dL
NITRITE: NEGATIVE
PH: 6 (ref 5.0–8.0)
PROTEIN: 30 mg/dL — AB
Specific Gravity, Urine: 1.013 (ref 1.005–1.030)

## 2016-08-11 LAB — I-STAT TROPONIN, ED: Troponin i, poc: 0.01 ng/mL (ref 0.00–0.08)

## 2016-08-11 LAB — BASIC METABOLIC PANEL
ANION GAP: 10 (ref 5–15)
BUN: 15 mg/dL (ref 6–20)
CHLORIDE: 106 mmol/L (ref 101–111)
CO2: 24 mmol/L (ref 22–32)
Calcium: 9 mg/dL (ref 8.9–10.3)
Creatinine, Ser: 1.07 mg/dL — ABNORMAL HIGH (ref 0.44–1.00)
GFR, EST AFRICAN AMERICAN: 54 mL/min — AB (ref >60)
GFR, EST NON AFRICAN AMERICAN: 47 mL/min — AB (ref >60)
Glucose, Bld: 127 mg/dL — ABNORMAL HIGH (ref 65–99)
POTASSIUM: 3.4 mmol/L — AB (ref 3.5–5.1)
SODIUM: 140 mmol/L (ref 135–145)

## 2016-08-11 LAB — CBC WITH DIFFERENTIAL/PLATELET
BASOS ABS: 0.1 10*3/uL (ref 0.0–0.1)
Basophils Relative: 1 %
EOS PCT: 3 %
Eosinophils Absolute: 0.2 10*3/uL (ref 0.0–0.7)
HEMATOCRIT: 35 % — AB (ref 36.0–46.0)
HEMOGLOBIN: 11.3 g/dL — AB (ref 12.0–15.0)
LYMPHS ABS: 1.4 10*3/uL (ref 0.7–4.0)
LYMPHS PCT: 20 %
MCH: 28 pg (ref 26.0–34.0)
MCHC: 32.3 g/dL (ref 30.0–36.0)
MCV: 86.8 fL (ref 78.0–100.0)
Monocytes Absolute: 0.6 10*3/uL (ref 0.1–1.0)
Monocytes Relative: 9 %
NEUTROS ABS: 4.6 10*3/uL (ref 1.7–7.7)
NEUTROS PCT: 67 %
PLATELETS: 264 10*3/uL (ref 150–400)
RBC: 4.03 MIL/uL (ref 3.87–5.11)
RDW: 14 % (ref 11.5–15.5)
WBC: 6.9 10*3/uL (ref 4.0–10.5)

## 2016-08-11 NOTE — ED Triage Notes (Signed)
Patient from home where she lives with family, arrives with [facility] for syncope.  EMS reports patient was seated at a table when she became unconscious.  EMS states patient did not fall out of the chair or hit her head.  On EMS arrival to scene patient moved supine to floor and blood pressure 80/50, responsive to pain only.  Once in ambulance EMS reports patient aroused and became responsive to voice.  Patient oriented to self only, this is baseline per EMS.  Patient in no apparent distress at this time.  22g saline lock in left forearm.

## 2016-08-11 NOTE — ED Provider Notes (Signed)
[facility] DEPT Provider Note   CSN: [id number] Arrival date & time: 08/11/16  1241     History   Chief Complaint Chief Complaint  Patient presents with  . Loss of Consciousness    HPI Quenisha Lovins Antu is a 83 y.o. female.  Patient is a 83 year old female with a history of dementia, seizures, hypertension and hyperlipidemia who presents after syncopal episode. Per EMS report, she was sitting in the chair eating lunch and had a syncopal episode. She became unresponsive for a short period time. She did not fall out of the chair. When EMS arrived they later to the ground and she became responsive to painful night only. She became completely arousable in route. Initial blood pressure was 80/50 but this improved in route. She was not given IV fluids by EMS. History is limited due to patient's dementia.      Past Medical History:  Diagnosis Date  . Adenocarcinoma ([place])   . Advanced dementia 05/30/2014  . Alzheimer disease   . Anxiety    STRESS AND MENOPAUSAL SYMPTOMS  . Arthritis   . Back pain   . Behavior disorder    REM  . Clinical depression 05/30/2014  . Dementia   . Depression   . GERD (gastroesophageal reflux disease)   . Hearing difficulty    DECREASED HEARING  . Hearing loss   . Hypercholesterolemia   . Hyperlipidemia   . Hypertension   . Hypothyroidism   . Lung cancer ([place]) 04/2010   upper lobe removed   . Memory problem   . Migraine   . Osteoarthritis    RIGHT KNEE  . Peripheral neuropathy   . Pneumonia 03/2014  . Seizures ([place])   . Syncope and collapse 02/2010   HOSPITALIZED    Patient Active Problem List   Diagnosis Date Noted  . Syncope 02/05/2016  . UTI (urinary tract infection) 02/05/2016  . Hypothyroidism 02/05/2016  . TIA (transient ischemic attack) 01/29/2016  . Alzheimer's disease 09/12/2014  . Advanced dementia 05/30/2014  . Head revolving around 05/30/2014  . Clinical depression 05/30/2014  . Anemia, iron deficiency 02/14/2014  .  Dementia in Alzheimer's disease 09/16/2012  . Bronchogenic lung cancer ([place]) 09/16/2012  . Preop cardiovascular exam 09/12/2010  . HTN (hypertension) 09/12/2010  . Hyperlipidemia 09/12/2010    Past Surgical History:  Procedure Laterality Date  . APPENDECTOMY    . BACK SURGERY    . CHOLECYSTECTOMY    . HEMORRHOID SURGERY    . KNEE SURGERY     RIGHT SIDE  . Left upper lobe resection  04/2010  . THYROIDECTOMY    . VAGINAL HYSTERECTOMY      OB History    No data available       Home Medications    Prior to Admission medications   Medication Sig Start Date End Date Taking? Authorizing Provider  Ascorbic Acid (VITAMIN C WITH ROSE HIPS) 500 MG tablet Take 500 mg by mouth daily.    Historical Provider, MD  aspirin EC 81 MG EC tablet Take 2 tablets (162 mg total) by mouth daily. Patient taking differently: Take 81 mg by mouth 2 (two) times daily.  02/08/16   Donne Hazel, MD  Calcium Carbonate (CALCIUM 600 PO) Take 1 tablet by mouth 2 (two) times daily. 1 po daily     Historical Provider, MD  cephALEXin (KEFLEX) 500 MG capsule Take 1 capsule (500 mg total) by mouth 2 (two) times daily. Patient not taking: Reported on 04/17/2016 03/28/16  Virgel Manifold, MD  Cholecalciferol (VITAMIN D) 1000 UNITS capsule Take 5,000 Units by mouth daily.     Historical Provider, MD  glucosamine-chondroitin 500-400 MG tablet Take 1 tablet by mouth 2 (two) times daily.     Historical Provider, MD  HYDROcodone-acetaminophen (NORCO/VICODIN) 5-325 MG tablet Take 1 tablet by mouth every 8 (eight) hours.  12/26/15   Historical Provider, MD  levETIRAcetam (KEPPRA) 250 MG tablet Take 1 tablet (250 mg total) by mouth 2 (two) times daily. 02/07/16   Donne Hazel, MD  levothyroxine (SYNTHROID, LEVOTHROID) 100 MCG tablet Take 100 mcg by mouth daily before breakfast.     Historical Provider, MD  LORazepam (ATIVAN) 0.5 MG tablet Take 0.5 mg by mouth every 8 (eight) hours as needed for anxiety.     Historical  Provider, MD  Memantine HCl-Donepezil HCl [facility]) 28-10 MG CP24 Take 1 capsule by mouth daily before breakfast. 03/11/16   Larey Seat, MD  pantoprazole (PROTONIX) 40 MG tablet Take 40 mg by mouth daily.    Historical Provider, MD  QUEtiapine (SEROQUEL) 50 MG tablet Take 1 tablet (50 mg total) by mouth at bedtime. 03/11/16   Asencion Partridge Dohmeier, MD  VESICARE 10 MG tablet TAKE ONE TABLET (10 MG TOTAL) BY MOUTH DAILY. 05/26/15   Historical Provider, MD    Family History Family History  Problem Relation Age of Onset  . Stroke Mother 33  . Cancer Father 30    Stomach  . Coronary artery disease Brother     2 brothers , but alive  . Migraines Child     Social History Social History  Substance Use Topics  . Smoking status: Former Smoker    Quit date: 04/29/1974  . Smokeless tobacco: Never Used  . Alcohol use No     Allergies   Codeine   Review of Systems Review of Systems  Unable to perform ROS: Dementia     Physical Exam Updated Vital Signs BP (!) 113/55   Pulse 73   Temp 98.5 F (36.9 C) (Oral)   Resp 14   SpO2 98%   Physical Exam  Constitutional: She appears well-developed and well-nourished.  HENT:  Head: Normocephalic and atraumatic.  Eyes: Pupils are equal, round, and reactive to light.  Neck: Normal range of motion. Neck supple.  Cardiovascular: Normal rate and regular rhythm.   Murmur heard. Pulmonary/Chest: Effort normal and breath sounds normal. No respiratory distress. She has no wheezes. She has no rales. She exhibits no tenderness.  Abdominal: Soft. Bowel sounds are normal. There is no tenderness. There is no rebound and no guarding.  Musculoskeletal: Normal range of motion. She exhibits no edema.  Lymphadenopathy:    She has no cervical adenopathy.  Neurological: She is alert.  Patient is nonverbal. She is awake with eyes open. She's maintaining her airway. She appears removing all extremity symmetrically but will not follow commands. She has  downgoing toes on Babinski. No facial drooping.  Skin: Skin is warm and dry. No rash noted.  Psychiatric: She has a normal mood and affect.     ED Treatments / Results  Labs (all labs ordered are listed, but only abnormal results are displayed) Labs Reviewed  BASIC METABOLIC PANEL - Abnormal; Notable for the following:       Result Value   Potassium 3.4 (*)    Glucose, Bld 127 (*)    Creatinine, Ser 1.07 (*)    GFR calc non Af Amer 47 (*)    GFR calc Af Wyvonnia Lora  54 (*)    All other components within normal limits  CBC WITH DIFFERENTIAL/PLATELET - Abnormal; Notable for the following:    Hemoglobin 11.3 (*)    HCT 35.0 (*)    All other components within normal limits  URINALYSIS, ROUTINE W REFLEX MICROSCOPIC - Abnormal; Notable for the following:    APPearance CLOUDY (*)    Glucose, UA 50 (*)    Protein, ur 30 (*)    Leukocytes, UA TRACE (*)    Bacteria, UA RARE (*)    Squamous Epithelial / LPF 6-30 (*)    All other components within normal limits  CBG MONITORING, ED - Abnormal; Notable for the following:    Glucose-Capillary 123 (*)    All other components within normal limits  URINE CULTURE  I-STAT TROPOININ, ED    EKG  EKG Interpretation  Date/Time:  Sunday August 11 2016 12:50:40 EDT Ventricular Rate:  78 PR Interval:    QRS Duration: 96 QT Interval:  393 QTC Calculation: 448 R Axis:   57 Text Interpretation:  Sinus rhythm Borderline repolarization abnormality Confirmed by Leslye Puccini  MD, Jewell Ryans ([id number]) on 08/11/2016 2:22:31 PM       Radiology Dg Chest 2 View  Result Date: 08/11/2016 CLINICAL DATA:  Syncope today EXAM: CHEST  2 VIEW COMPARISON:  02/05/2016 FINDINGS: Cardiomediastinal silhouette is stable. No infiltrate or pulmonary edema. Again noted prior rib defect probable post surgical left fifth posterior rib. Degenerative changes mid and lower thoracic spine. IMPRESSION: No active cardiopulmonary disease. No significant change. Electronically Signed   By: Lahoma Crocker M.D.   On: 08/11/2016 14:18    Procedures Procedures (including critical care time)  Medications Ordered in ED Medications - No data to display   Initial Impression / Assessment and Plan / ED Course  I have reviewed the triage vital signs and the nursing notes.  Pertinent labs & imaging results that were available during my care of the patient were reviewed by me and considered in my medical decision making (see chart for details).     Patient is an 83 year old female who presents after syncopal episode. She's been completely awake and responsive in the ED. She's at her baseline mental status. She's had no episodes of hypotension or bradycardia in the ED. Her EKG is non-concerning. Her labs are similar to baseline values. She's had multiple similar episodes in the past. She was admitted in October of last year for symptoms and had a extensive evaluation and testing. It was felt that she may be having subtle seizures. The family states that these episodes normally happy when she's sitting up or going to the bathroom. Could be related to some vagal episodes as well. Regardless, I don't feel that she needs is in the hospital. She's had repeat evaluations for similar symptoms. She was discharged home in good condition. Family was comfortable with this plan. She was encouraged to have follow-up with her primary care provider.  Final Clinical Impressions(s) / ED Diagnoses   Final diagnoses:  Syncope and collapse    New Prescriptions New Prescriptions   No medications on file     Malvin Johns, MD 08/11/16 1444

## 2016-08-12 LAB — URINE CULTURE: Culture: NO GROWTH

## 2016-08-28 ENCOUNTER — Other Ambulatory Visit: Payer: Self-pay | Admitting: Neurology

## 2016-08-28 DIAGNOSIS — F02818 Dementia in other diseases classified elsewhere, unspecified severity, with other behavioral disturbance: Secondary | ICD-10-CM

## 2016-08-28 DIAGNOSIS — G308 Other Alzheimer's disease: Principal | ICD-10-CM

## 2016-08-28 DIAGNOSIS — F0281 Dementia in other diseases classified elsewhere with behavioral disturbance: Secondary | ICD-10-CM

## 2016-08-31 ENCOUNTER — Other Ambulatory Visit: Payer: Self-pay | Admitting: Neurology

## 2016-08-31 DIAGNOSIS — F0281 Dementia in other diseases classified elsewhere with behavioral disturbance: Secondary | ICD-10-CM

## 2016-08-31 DIAGNOSIS — G301 Alzheimer's disease with late onset: Principal | ICD-10-CM

## 2016-08-31 DIAGNOSIS — F02818 Dementia in other diseases classified elsewhere, unspecified severity, with other behavioral disturbance: Secondary | ICD-10-CM

## 2016-09-09 ENCOUNTER — Other Ambulatory Visit: Payer: Self-pay | Admitting: Neurology

## 2016-09-10 NOTE — Telephone Encounter (Signed)
Needs an appt for further refills

## 2016-09-17 ENCOUNTER — Telehealth: Payer: Self-pay | Admitting: Neurology

## 2016-09-17 DIAGNOSIS — G301 Alzheimer's disease with late onset: Principal | ICD-10-CM

## 2016-09-17 DIAGNOSIS — F0281 Dementia in other diseases classified elsewhere with behavioral disturbance: Secondary | ICD-10-CM

## 2016-09-17 DIAGNOSIS — F02818 Dementia in other diseases classified elsewhere, unspecified severity, with other behavioral disturbance: Secondary | ICD-10-CM

## 2016-09-17 NOTE — Telephone Encounter (Signed)
Pt's daughter/Pat said she is not able to provide care for her mother anymore, she is wanting to discuss the steps in finding a nursing home for her. Daughter said the pt has been bedridden for the past month. Please call

## 2016-09-17 NOTE — Telephone Encounter (Signed)
I spoke to Dr. Brett Fairy regarding this request. Dr. Brett Fairy recommended a referral to social work and referral to palliative care and a referral to home health for this pt. The social work should be able to assist pt in finding a nursing home.  I spoke to pt's daughter, Fraser Din, and she is agreeable to this. Pt's daughter has not been able to find a suitable nursing home yet. Fraser Din knows that we will set up these referrals and the offices will contact Pat. Fraser Din reports that the pt has been "bedridden" for longer than a month. She is having trouble working with pt's insurance to get approval and admittance to a SNF. She is applying for medicaid. Fraser Din is needing help getting pt dressed, to the bathroom, etc while SNF placement is in process.

## 2016-09-18 ENCOUNTER — Telehealth: Payer: Self-pay | Admitting: Neurology

## 2016-09-18 NOTE — Telephone Encounter (Signed)
Order for [facility] with aide and medical social work already placed. Let me know if this is not acceptable.

## 2016-09-18 NOTE — Telephone Encounter (Signed)
Cyril Mourning spoke with Marita Kansas from [facility] can you put in order for Nursing only since patient is bed written . Thanks [employer].

## 2016-09-18 NOTE — Telephone Encounter (Signed)
Called and spoke to Patient's daughter she is aware Alvis Lemmings will be calling  Marita Kansas is working on Referral . I have also [place] as well for family.

## 2016-09-18 NOTE — Telephone Encounter (Signed)
Dr. Brett Fairy ordered palliative care; does [facility] provide palliative care?

## 2016-09-18 NOTE — Telephone Encounter (Signed)
Yes they do

## 2016-11-04 ENCOUNTER — Telehealth: Payer: Self-pay | Admitting: Neurology

## 2016-11-04 NOTE — Telephone Encounter (Signed)
Patient's daughter Mardene Celeste called to advise patient passed away on 11-05-16. Mardene Celeste would like a calll back to speak to Dr. Brett Fairy to thank her for the care she gave the patient.

## 2016-11-27 DEATH — deceased

## 2017-10-11 IMAGING — DX DG HAND COMPLETE 3+V*R*
4 series · 4 of 4 positions shown · non-contrast
Comparison: None.

CLINICAL DATA: Acute right hand pain swelling without known injury.

EXAM:
RIGHT HAND - COMPLETE 3+ VIEW

[hand pa]
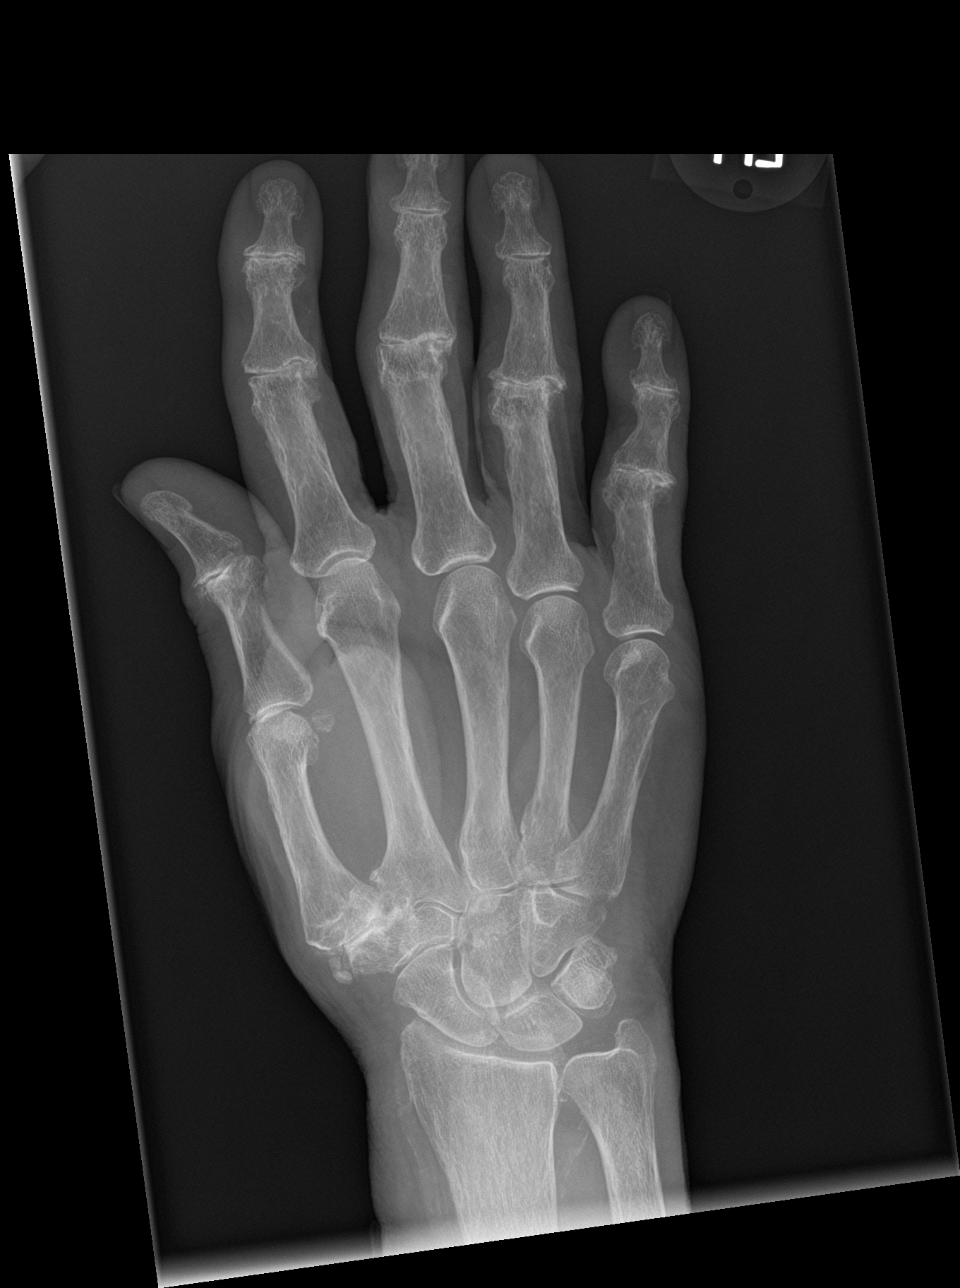

[hand obl]
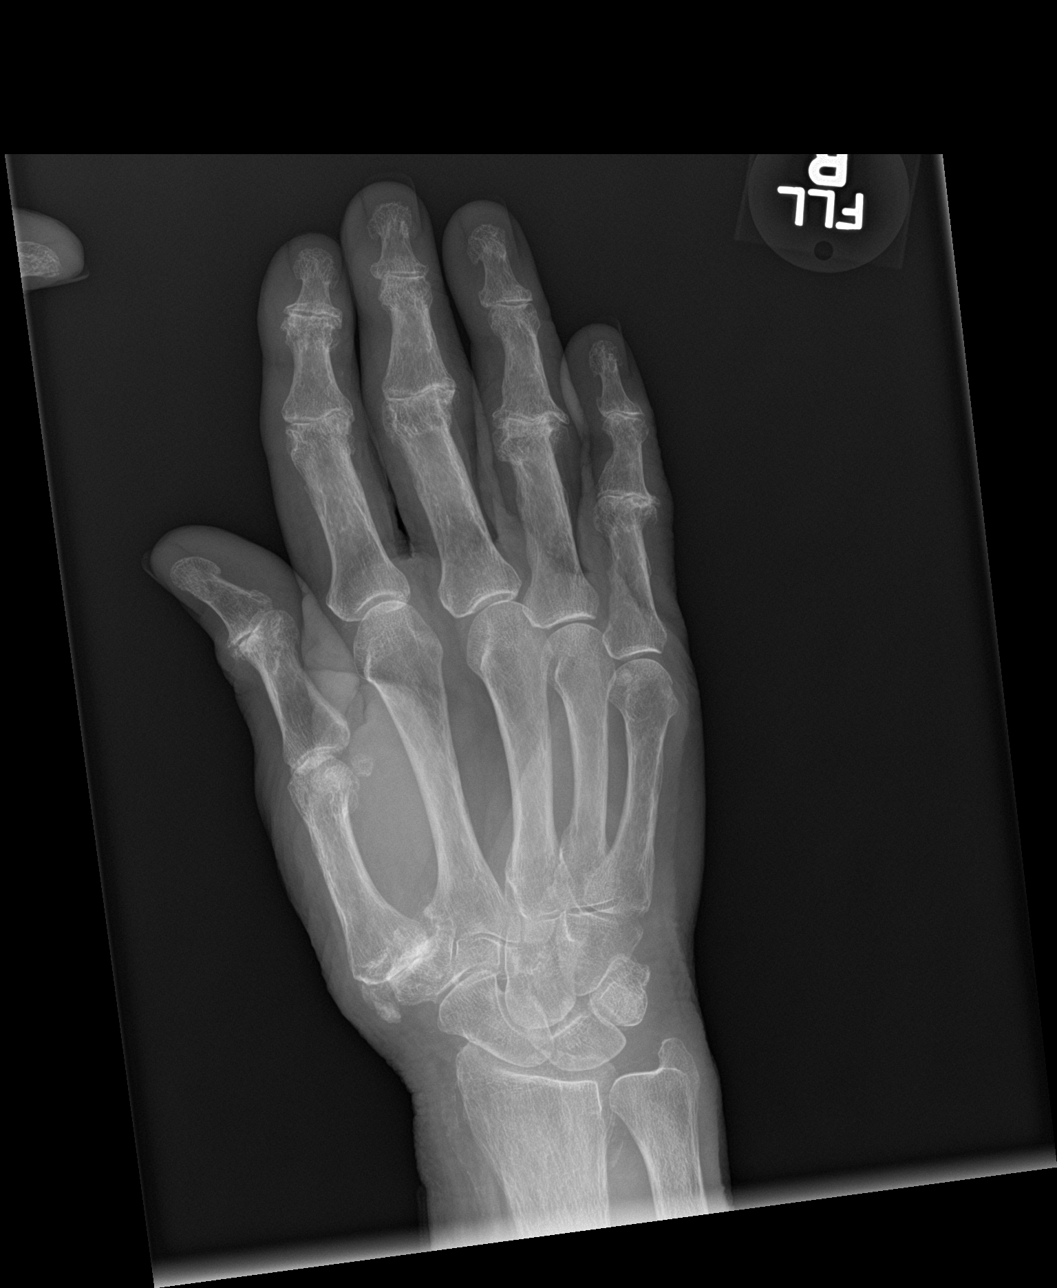

[hand lat (1 of 2)]
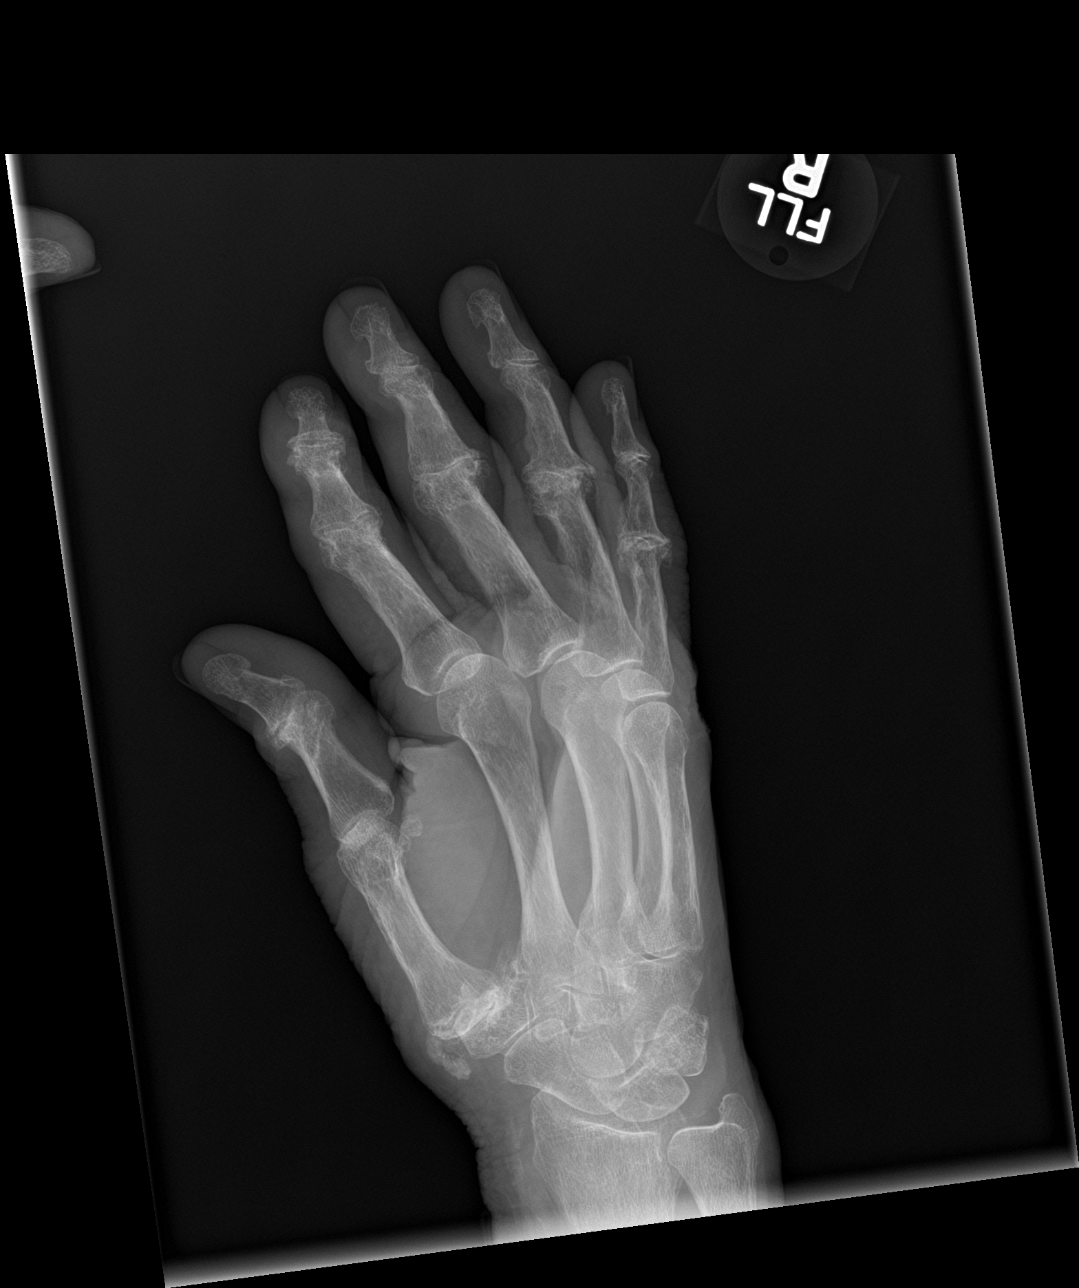

[hand lat (2 of 2)]
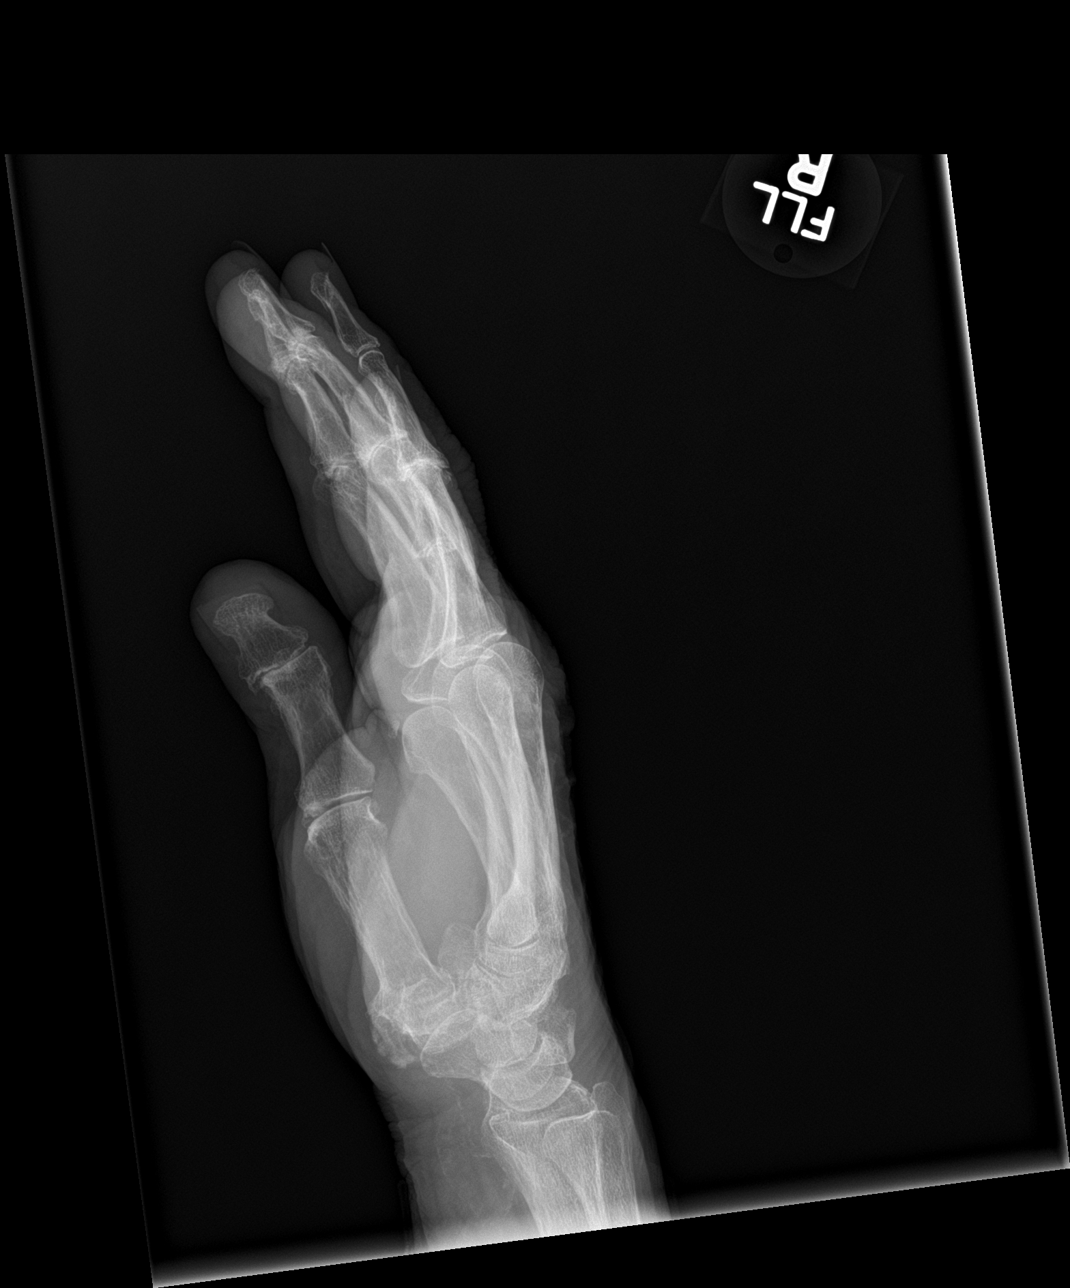

[4 of 4 positions shown; findings below may reference images not displayed]

FINDINGS: No fracture or dislocation is noted. No soft tissue abnormality is
noted. Severe degenerative changes seen involving the first
carpometacarpal joint. Degenerative changes are also seen involving
the proximal interphalangeal joints as well as the second and fifth
distal interphalangeal joints.
IMPRESSION: Findings consistent with osteoarthritis involving multiple joints as
described above. No acute abnormality seen in the right hand.

## 2018-05-01 IMAGING — CT CT HEAD CODE STROKE
3 series · 15 of 46 positions shown, 18 images · non-contrast
Comparison: CT head without contrast 02/05/2016.

CLINICAL DATA: Code stroke. Right-sided facial droop beginning at 6
p.m..

EXAM:
CT HEAD WITHOUT CONTRAST
TECHNIQUE: Contiguous axial images were obtained from the base of the skull
through the vertex without intravenous contrast.

[Series 2: head code stroke wo · axial · 0.47mm/px · z∈[+1362,+1482]mm · 9 of 29 slices shown, 12 images]
[im 3/29  brain]
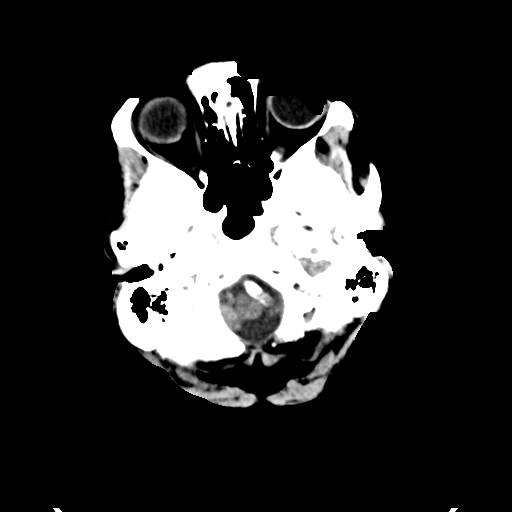
[im 3/29  bone]
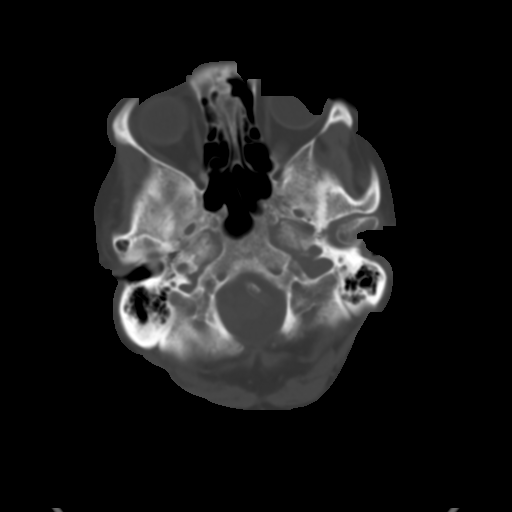
[im 6/29  brain]
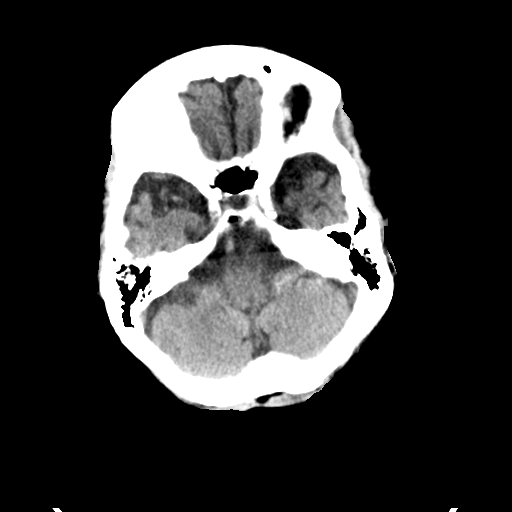
[im 9/29  brain]
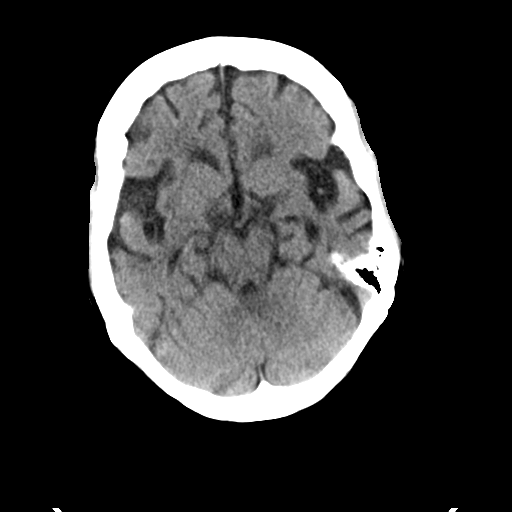
[im 12/29  brain]
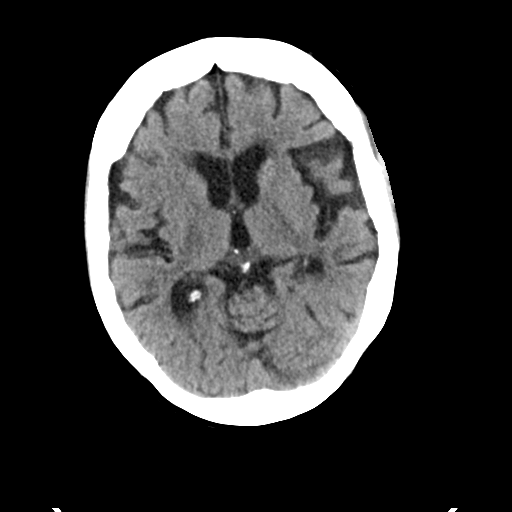
[im 15/29  brain]
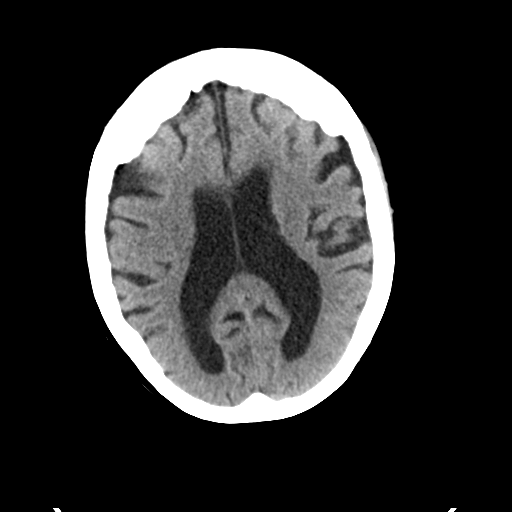
[im 15/29  bone]
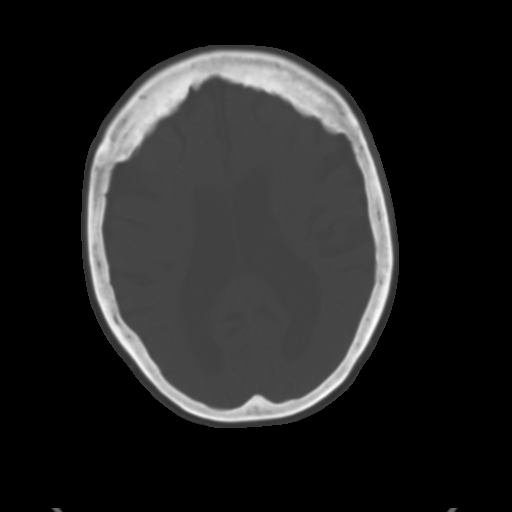
[im 18/29  brain]
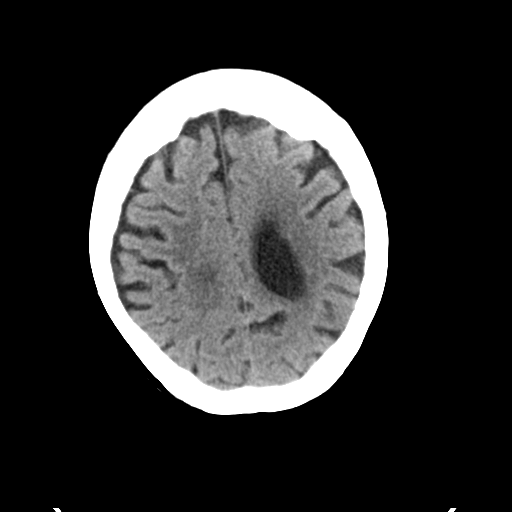
[im 21/29  brain]
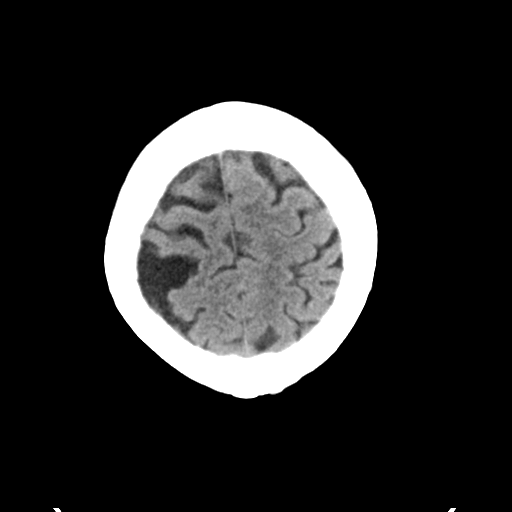
[im 24/29  brain]
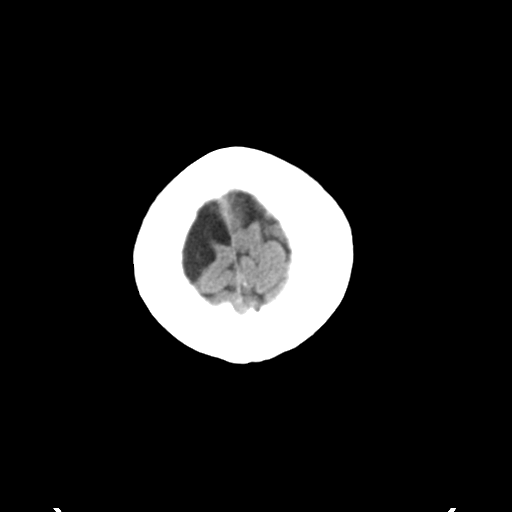
[im 27/29  brain]
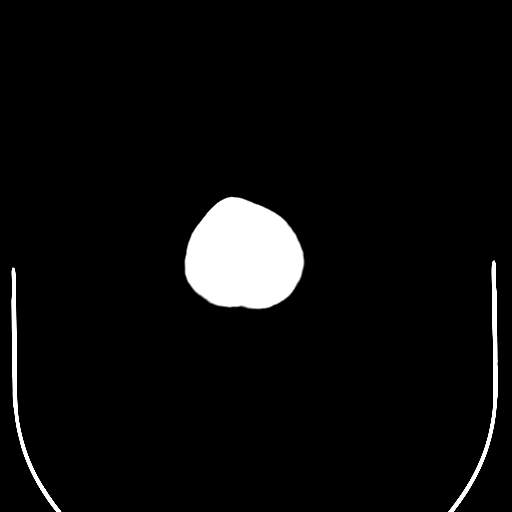
[im 27/29  bone]
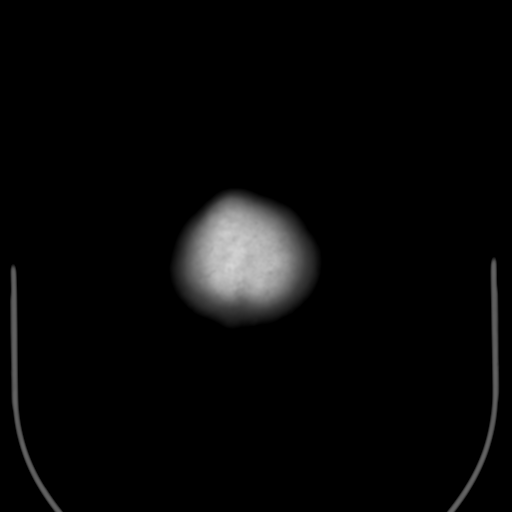

[Series 4: coronal soft tissue · coronal · 0.35mm/px · 3 of 67 slices shown]
[im 23/67  brain]
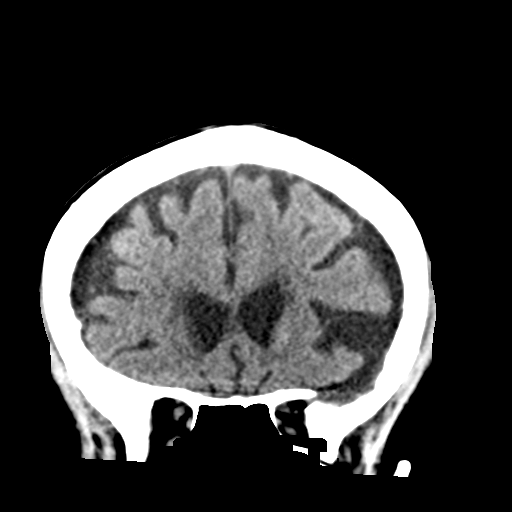
[im 30/67  brain]
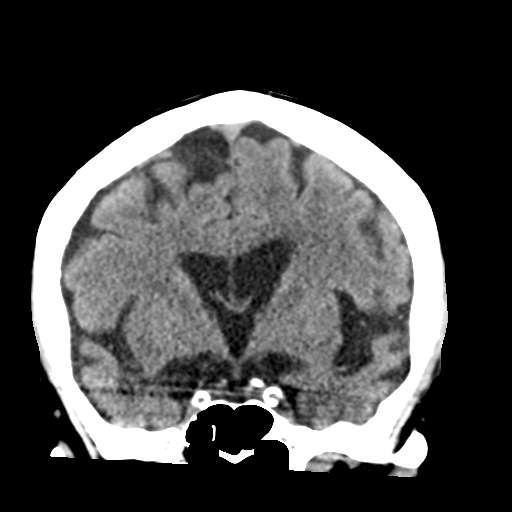
[im 37/67  brain]
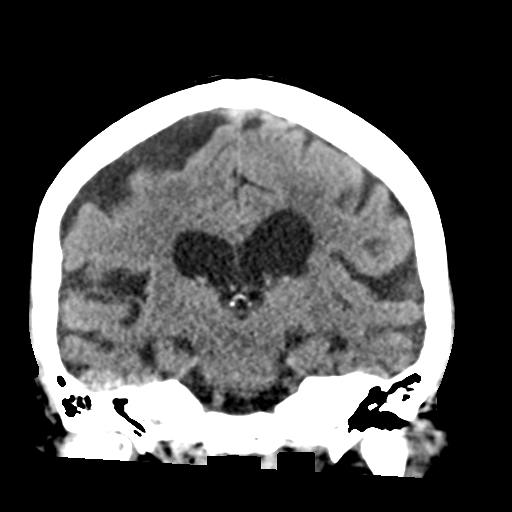

[Series 5: sagittal soft tissue · sagittal · 0.32mm/px · 3 of 58 slices shown]
[im 20/58  brain]
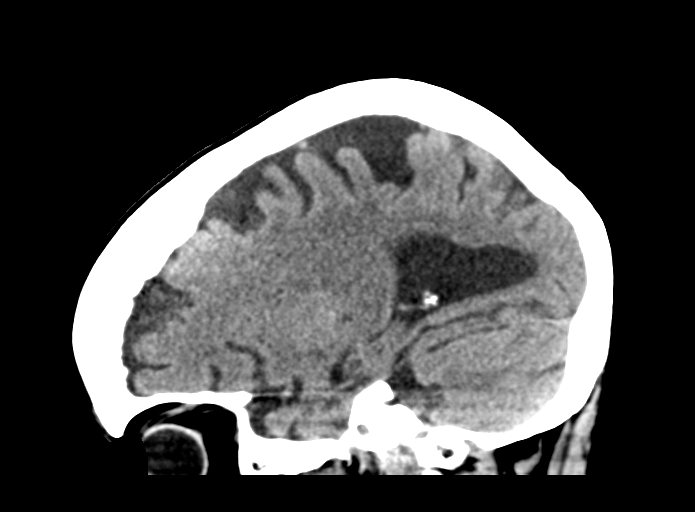
[im 29/58  brain]
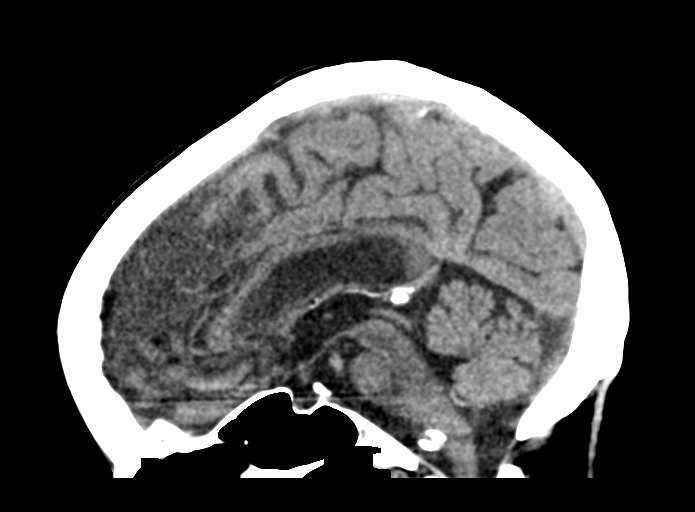
[im 39/58  brain]
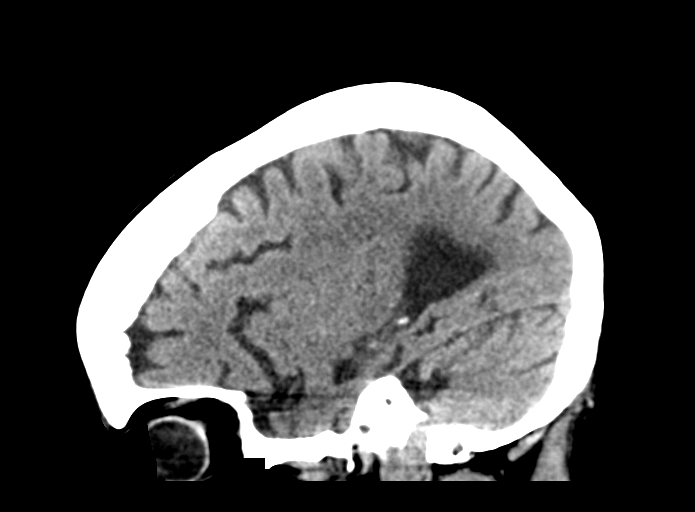

[15 of 46 positions shown; findings below may reference images not displayed]

FINDINGS: Brain: Moderate generalized atrophy is present. No acute infarct,
hemorrhage, or mass lesion is present. White matter changes are
stable is well. The basal ganglia are intact. The insular cortex is
normal bilaterally. The brainstem is unremarkable. Cerebellum is
within normal limits.

Vascular: Atherosclerotic calcifications are present at the
cavernous internal carotid artery and vertebral arteries
bilaterally. There is no hyperdense vessel.

Skull: Calvarium is intact.  Hyperostosis frontalis is again noted.

Sinuses/Orbits: The paranasal sinuses and mastoid air cells are
clear. The globes and orbits are intact.

ASPECTS (Alberta Stroke Program Early CT Score)

- Ganglionic level infarction (caudate, lentiform nuclei, internal
capsule, insula, M1-M3 cortex): [DATE]

- Supraganglionic infarction (M4-M6 cortex): [DATE]

Total score (0-10 with 10 being normal): [DATE]
IMPRESSION: 1. No acute intracranial abnormality or significant interval change.
2. Stable atrophy and white matter disease reflects the sequela of
chronic microvascular ischemia.
3. ASPECTS is [DATE]
These results were called by telephone at the time of interpretation
on 03/27/2016 at [DATE] to Dr. ERINEUSA BORNHOFEN , who verbally
acknowledged these results.
